# Patient Record
Sex: Female | Born: 1940 | Race: White | Hispanic: No | Smoking: Former smoker
Health system: Southern US, Community
[De-identification: ages and names within clinical notes are randomized; demographics above are authoritative.]

## PROBLEM LIST (undated history)

## (undated) DIAGNOSIS — I4510 Unspecified right bundle-branch block: Secondary | ICD-10-CM

## (undated) DIAGNOSIS — C189 Malignant neoplasm of colon, unspecified: Secondary | ICD-10-CM

## (undated) DIAGNOSIS — F0280 Dementia in other diseases classified elsewhere without behavioral disturbance: Secondary | ICD-10-CM

## (undated) DIAGNOSIS — E785 Hyperlipidemia, unspecified: Secondary | ICD-10-CM

## (undated) DIAGNOSIS — F32A Depression, unspecified: Secondary | ICD-10-CM

## (undated) DIAGNOSIS — I509 Heart failure, unspecified: Secondary | ICD-10-CM

## (undated) DIAGNOSIS — I2699 Other pulmonary embolism without acute cor pulmonale: Secondary | ICD-10-CM

## (undated) DIAGNOSIS — I5021 Acute systolic (congestive) heart failure: Secondary | ICD-10-CM

## (undated) DIAGNOSIS — J45909 Unspecified asthma, uncomplicated: Secondary | ICD-10-CM

## (undated) DIAGNOSIS — M810 Age-related osteoporosis without current pathological fracture: Secondary | ICD-10-CM

## (undated) DIAGNOSIS — G309 Alzheimer's disease, unspecified: Secondary | ICD-10-CM

## (undated) DIAGNOSIS — K219 Gastro-esophageal reflux disease without esophagitis: Secondary | ICD-10-CM

## (undated) DIAGNOSIS — J449 Chronic obstructive pulmonary disease, unspecified: Secondary | ICD-10-CM

## (undated) DIAGNOSIS — F329 Major depressive disorder, single episode, unspecified: Secondary | ICD-10-CM

## (undated) DIAGNOSIS — R1310 Dysphagia, unspecified: Secondary | ICD-10-CM

## (undated) HISTORY — DX: Chronic obstructive pulmonary disease, unspecified: J44.9

## (undated) HISTORY — DX: Malignant neoplasm of colon, unspecified: C18.9

## (undated) HISTORY — DX: Alzheimer's disease, unspecified: G30.9

## (undated) HISTORY — DX: Acute systolic (congestive) heart failure: I50.21

## (undated) HISTORY — PX: COLON SURGERY: SHX602

## (undated) HISTORY — PX: BACK SURGERY: SHX140

## (undated) HISTORY — DX: Unspecified asthma, uncomplicated: J45.909

## (undated) HISTORY — PX: TONSILLECTOMY: SUR1361

## (undated) HISTORY — DX: Unspecified right bundle-branch block: I45.10

## (undated) HISTORY — DX: Major depressive disorder, single episode, unspecified: F32.9

## (undated) HISTORY — DX: Depression, unspecified: F32.A

## (undated) HISTORY — DX: Gastro-esophageal reflux disease without esophagitis: K21.9

## (undated) HISTORY — DX: Heart failure, unspecified: I50.9

## (undated) HISTORY — DX: Other pulmonary embolism without acute cor pulmonale: I26.99

## (undated) HISTORY — DX: Dementia in other diseases classified elsewhere without behavioral disturbance: F02.80

## (undated) HISTORY — DX: Hyperlipidemia, unspecified: E78.5

## (undated) HISTORY — PX: ABDOMINAL HYSTERECTOMY: SHX81

---

## 1998-03-16 ENCOUNTER — Ambulatory Visit (HOSPITAL_COMMUNITY): Admission: RE | Admit: 1998-03-16 | Discharge: 1998-03-16 | Payer: Self-pay | Admitting: Neurosurgery

## 1998-06-14 ENCOUNTER — Inpatient Hospital Stay (HOSPITAL_COMMUNITY): Admission: RE | Admit: 1998-06-14 | Discharge: 1998-06-16 | Payer: Self-pay | Admitting: Neurosurgery

## 1998-12-27 ENCOUNTER — Encounter: Payer: Self-pay | Admitting: Neurosurgery

## 1998-12-27 ENCOUNTER — Ambulatory Visit (HOSPITAL_COMMUNITY): Admission: RE | Admit: 1998-12-27 | Discharge: 1998-12-27 | Payer: Self-pay | Admitting: Neurosurgery

## 1999-09-11 ENCOUNTER — Ambulatory Visit (HOSPITAL_COMMUNITY): Admission: RE | Admit: 1999-09-11 | Discharge: 1999-09-11 | Payer: Self-pay | Admitting: *Deleted

## 2000-03-13 ENCOUNTER — Ambulatory Visit (HOSPITAL_COMMUNITY): Admission: RE | Admit: 2000-03-13 | Discharge: 2000-03-13 | Payer: Self-pay | Admitting: Neurosurgery

## 2000-03-13 ENCOUNTER — Encounter: Payer: Self-pay | Admitting: Neurosurgery

## 2000-04-16 ENCOUNTER — Encounter: Payer: Self-pay | Admitting: Neurosurgery

## 2000-04-20 ENCOUNTER — Inpatient Hospital Stay (HOSPITAL_COMMUNITY): Admission: RE | Admit: 2000-04-20 | Discharge: 2000-04-22 | Payer: Self-pay | Admitting: Neurosurgery

## 2000-04-20 ENCOUNTER — Encounter: Payer: Self-pay | Admitting: Neurosurgery

## 2001-04-25 ENCOUNTER — Encounter: Payer: Self-pay | Admitting: Neurosurgery

## 2001-04-25 ENCOUNTER — Encounter: Admission: RE | Admit: 2001-04-25 | Discharge: 2001-04-25 | Payer: Self-pay | Admitting: Neurosurgery

## 2002-05-05 ENCOUNTER — Encounter: Payer: Self-pay | Admitting: Neurosurgery

## 2002-05-05 ENCOUNTER — Ambulatory Visit (HOSPITAL_COMMUNITY): Admission: RE | Admit: 2002-05-05 | Discharge: 2002-05-05 | Payer: Self-pay | Admitting: Neurosurgery

## 2002-06-07 ENCOUNTER — Encounter: Admission: RE | Admit: 2002-06-07 | Discharge: 2002-06-07 | Payer: Self-pay | Admitting: Neurosurgery

## 2002-06-07 ENCOUNTER — Encounter: Payer: Self-pay | Admitting: Neurosurgery

## 2002-06-22 ENCOUNTER — Encounter: Payer: Self-pay | Admitting: Neurosurgery

## 2002-06-22 ENCOUNTER — Encounter: Admission: RE | Admit: 2002-06-22 | Discharge: 2002-06-22 | Payer: Self-pay | Admitting: Neurosurgery

## 2004-09-28 ENCOUNTER — Emergency Department (HOSPITAL_COMMUNITY): Admission: EM | Admit: 2004-09-28 | Discharge: 2004-09-29 | Payer: Self-pay | Admitting: Emergency Medicine

## 2004-12-25 ENCOUNTER — Ambulatory Visit (HOSPITAL_COMMUNITY): Admission: RE | Admit: 2004-12-25 | Discharge: 2004-12-25 | Payer: Self-pay | Admitting: Neurosurgery

## 2005-07-24 ENCOUNTER — Ambulatory Visit: Payer: Self-pay | Admitting: Psychiatry

## 2006-03-16 ENCOUNTER — Ambulatory Visit (HOSPITAL_COMMUNITY): Admission: RE | Admit: 2006-03-16 | Discharge: 2006-03-16 | Payer: Self-pay | Admitting: Neurosurgery

## 2006-10-21 ENCOUNTER — Ambulatory Visit (HOSPITAL_COMMUNITY): Payer: Self-pay | Admitting: Psychology

## 2006-10-25 ENCOUNTER — Ambulatory Visit (HOSPITAL_COMMUNITY): Payer: Self-pay | Admitting: Psychology

## 2006-11-17 ENCOUNTER — Ambulatory Visit (HOSPITAL_COMMUNITY): Payer: Self-pay | Admitting: Psychology

## 2006-12-28 ENCOUNTER — Ambulatory Visit (HOSPITAL_COMMUNITY): Payer: Self-pay | Admitting: Psychology

## 2007-01-13 ENCOUNTER — Ambulatory Visit (HOSPITAL_COMMUNITY): Payer: Self-pay | Admitting: Psychology

## 2007-04-30 ENCOUNTER — Emergency Department (HOSPITAL_COMMUNITY): Admission: EM | Admit: 2007-04-30 | Discharge: 2007-04-30 | Payer: Self-pay | Admitting: Emergency Medicine

## 2007-05-12 ENCOUNTER — Ambulatory Visit (HOSPITAL_COMMUNITY): Payer: Self-pay | Admitting: Psychology

## 2007-06-10 ENCOUNTER — Ambulatory Visit (HOSPITAL_COMMUNITY): Payer: Self-pay | Admitting: Psychology

## 2007-07-01 ENCOUNTER — Ambulatory Visit (HOSPITAL_COMMUNITY): Payer: Self-pay | Admitting: Psychology

## 2007-08-02 ENCOUNTER — Ambulatory Visit (HOSPITAL_COMMUNITY): Payer: Self-pay | Admitting: Psychology

## 2007-09-01 ENCOUNTER — Ambulatory Visit (HOSPITAL_COMMUNITY): Payer: Self-pay | Admitting: Psychology

## 2008-09-16 ENCOUNTER — Emergency Department (HOSPITAL_COMMUNITY): Admission: EM | Admit: 2008-09-16 | Discharge: 2008-09-16 | Payer: Self-pay | Admitting: Emergency Medicine

## 2009-10-28 ENCOUNTER — Encounter: Admission: RE | Admit: 2009-10-28 | Discharge: 2009-10-28 | Payer: Self-pay | Admitting: Neurosurgery

## 2010-03-11 ENCOUNTER — Inpatient Hospital Stay (HOSPITAL_COMMUNITY)
Admission: RE | Admit: 2010-03-11 | Discharge: 2010-03-14 | Payer: Self-pay | Source: Home / Self Care | Admitting: Neurosurgery

## 2010-09-29 ENCOUNTER — Encounter (INDEPENDENT_AMBULATORY_CARE_PROVIDER_SITE_OTHER): Payer: Self-pay | Admitting: *Deleted

## 2010-12-10 ENCOUNTER — Ambulatory Visit (HOSPITAL_COMMUNITY): Admission: RE | Admit: 2010-12-10 | Payer: Self-pay | Source: Home / Self Care | Admitting: Internal Medicine

## 2010-12-11 ENCOUNTER — Observation Stay (HOSPITAL_COMMUNITY)
Admission: RE | Admit: 2010-12-11 | Discharge: 2010-12-12 | Payer: Self-pay | Source: Home / Self Care | Attending: Neurological Surgery | Admitting: Neurological Surgery

## 2010-12-11 ENCOUNTER — Encounter (INDEPENDENT_AMBULATORY_CARE_PROVIDER_SITE_OTHER): Payer: Self-pay | Admitting: Neurological Surgery

## 2011-01-04 ENCOUNTER — Encounter: Payer: Self-pay | Admitting: Internal Medicine

## 2011-01-04 ENCOUNTER — Encounter: Payer: Self-pay | Admitting: Neurosurgery

## 2011-01-05 ENCOUNTER — Encounter: Payer: Self-pay | Admitting: Internal Medicine

## 2011-01-13 NOTE — Letter (Signed)
Summary: Generic Letter, Intro to Referring  [facility]  [address]   [place], [place] [address]   Phone: [phone]  Fax: [phone]      September 29, 2010             RE: Paige Schwartz   [birth year]                 [address]                 [place], [place]  [address]  Dear Kemper Durie,    the above patient was a No Show for her appointment on 09/10/10 at 1:30pm with [place]            Sincerely,    Diana Eves  [facility] Ph: [phone]   Fax: [phone]

## 2011-01-21 NOTE — Letter (Signed)
Summary: [facility]  [facility]   Imported By: Sherian Rein 01/16/2011 08:52:59  _____________________________________________________________________  External Attachment:    Type:   Image     Comment:   External Document

## 2011-02-23 LAB — URINALYSIS, ROUTINE W REFLEX MICROSCOPIC
Bilirubin Urine: NEGATIVE
Glucose, UA: NEGATIVE mg/dL
Hgb urine dipstick: NEGATIVE
Ketones, ur: NEGATIVE mg/dL
Nitrite: POSITIVE — AB
Protein, ur: NEGATIVE mg/dL
Specific Gravity, Urine: 1.018 (ref 1.005–1.030)
Urobilinogen, UA: 1 mg/dL (ref 0.0–1.0)
pH: 6.5 (ref 5.0–8.0)

## 2011-02-23 LAB — SURGICAL PCR SCREEN: Staphylococcus aureus: POSITIVE — AB

## 2011-02-23 LAB — PROTIME-INR
INR: 0.96 (ref 0.00–1.49)
Prothrombin Time: 13 seconds (ref 11.6–15.2)

## 2011-02-23 LAB — BASIC METABOLIC PANEL
BUN: 9 mg/dL (ref 6–23)
CO2: 25 mEq/L (ref 19–32)
Calcium: 8.9 mg/dL (ref 8.4–10.5)
Chloride: 108 mEq/L (ref 96–112)
Creatinine, Ser: 0.69 mg/dL (ref 0.4–1.2)
GFR calc Af Amer: >60 mL/min (ref >60)
GFR calc non Af Amer: >60 mL/min (ref >60)
Glucose, Bld: 86 mg/dL (ref 70–99)
Potassium: 3.8 mEq/L (ref 3.5–5.1)
Sodium: 139 mEq/L (ref 135–145)

## 2011-02-23 LAB — URINE MICROSCOPIC-ADD ON

## 2011-02-23 LAB — CBC
HCT: 35.7 % — ABNORMAL LOW (ref 36.0–46.0)
Hemoglobin: 11.3 g/dL — ABNORMAL LOW (ref 12.0–15.0)
MCH: 27.3 pg (ref 26.0–34.0)
MCHC: 31.7 g/dL (ref 30.0–36.0)
MCV: 86.2 fL (ref 78.0–100.0)
Platelets: 249 10*3/uL (ref 150–400)
RBC: 4.14 MIL/uL (ref 3.87–5.11)
RDW: 16.4 % — ABNORMAL HIGH (ref 11.5–15.5)
WBC: 5.4 10*3/uL (ref 4.0–10.5)

## 2011-03-09 LAB — COMPREHENSIVE METABOLIC PANEL
ALT: 16 U/L (ref 0–35)
AST: 24 U/L (ref 0–37)
Albumin: 3.3 g/dL — ABNORMAL LOW (ref 3.5–5.2)
Alkaline Phosphatase: 68 U/L (ref 39–117)
BUN: 12 mg/dL (ref 6–23)
CO2: 28 mEq/L (ref 19–32)
Calcium: 9.2 mg/dL (ref 8.4–10.5)
Chloride: 109 mEq/L (ref 96–112)
Creatinine, Ser: 0.91 mg/dL (ref 0.4–1.2)
GFR calc Af Amer: >60 mL/min (ref >60)
GFR calc non Af Amer: >60 mL/min (ref >60)
Glucose, Bld: 84 mg/dL (ref 70–99)
Potassium: 4 mEq/L (ref 3.5–5.1)
Sodium: 145 mEq/L (ref 135–145)
Total Bilirubin: 0.3 mg/dL (ref 0.3–1.2)
Total Protein: 6.2 g/dL (ref 6.0–8.3)

## 2011-03-09 LAB — URINALYSIS, ROUTINE W REFLEX MICROSCOPIC
Bilirubin Urine: NEGATIVE
Hgb urine dipstick: NEGATIVE
Ketones, ur: 15 mg/dL — AB
Nitrite: POSITIVE — AB
Protein, ur: NEGATIVE mg/dL
Specific Gravity, Urine: 1.02 (ref 1.005–1.030)
Urobilinogen, UA: 0.2 mg/dL (ref 0.0–1.0)

## 2011-03-09 LAB — DIFFERENTIAL
Basophils Absolute: 0 10*3/uL (ref 0.0–0.1)
Basophils Relative: 1 % (ref 0–1)
Eosinophils Absolute: 0.1 10*3/uL (ref 0.0–0.7)
Eosinophils Relative: 2 % (ref 0–5)
Lymphocytes Relative: 14 % (ref 12–46)
Lymphs Abs: 0.9 10*3/uL (ref 0.7–4.0)
Monocytes Absolute: 0.4 10*3/uL (ref 0.1–1.0)
Monocytes Relative: 6 % (ref 3–12)
Neutro Abs: 5.3 10*3/uL (ref 1.7–7.7)
Neutrophils Relative %: 78 % — ABNORMAL HIGH (ref 43–77)

## 2011-03-09 LAB — CBC
HCT: 38.5 % (ref 36.0–46.0)
Hemoglobin: 13.1 g/dL (ref 12.0–15.0)
MCHC: 34 g/dL (ref 30.0–36.0)
MCV: 88.2 fL (ref 78.0–100.0)
Platelets: 189 10*3/uL (ref 150–400)
RBC: 4.37 MIL/uL (ref 3.87–5.11)
RDW: 16.3 % — ABNORMAL HIGH (ref 11.5–15.5)
WBC: 6.9 10*3/uL (ref 4.0–10.5)

## 2011-03-09 LAB — PROTIME-INR
INR: 0.98 (ref 0.00–1.49)
Prothrombin Time: 12.9 seconds (ref 11.6–15.2)

## 2011-03-09 LAB — SURGICAL PCR SCREEN
MRSA, PCR: NEGATIVE
Staphylococcus aureus: NEGATIVE

## 2011-03-09 LAB — APTT: aPTT: 22 seconds — ABNORMAL LOW (ref 24–37)

## 2011-05-01 NOTE — Op Note (Signed)
[place]  Patient:    NIOKA, THORINGTON                      MRN: [id number] Proc. Date: 04/20/00 Adm. Date:  [id number] Attending:  Emeterio Reeve                           Operative Report  PREOPERATIVE DIAGNOSIS:  Left L5 radiculopathy related to lateral recess stenosis and herniated disk at L5-S1 in the extraforaminal position.  POSTOPERATIVE DIAGNOSIS:  Left L5 radiculopathy related to lateral recess stenosis and herniated disk at L5-S1 in the extraforaminal position.  OPERATION PERFORMED:  Left L5-S1 extraforaminal decompression.  SURGEON:  Payton Doughty, M.D.  ANESTHESIA:  General.  PREP:  Sterile Betadine prep and scrub with alcohol wipe.  COMPLICATIONS:  None.  DESCRIPTION OF PROCEDURE:  The patient is a 70 year old lady with a left L5 radiculopathy related to a foraminal compression at L5-S1 extraforaminally. The patient was taken to the operating room, smoothly anesthetized and intubated, and placed prone on the operating table.  Following shave, prep and drape in the usual sterile fashion the skin was infiltrated with 1% lidocaine 1:400,000 epinephrine.  The skin was incised from approximately S2 up to L4. The old fusion from L4 to S1 was identified at its superior end and x-ray confirmed the correctness of level of dissection laterally to the fusion revealed the sacral ala and dissection anteriorly and medially revealed the superior edge of ala.  Intraoperative x-ray confirmed that the L5-S1 neural foramen had been identified.  Part of the sacral ala was removed with a drill and the L5 nerve root carefully identified just above the S1 pedicle. Decompression of this nerve root was undertaken and careful exploration revealed that there was a minimally bulging disk which was grasped and annular fibers removed but there was no large herniation.  The tight stenosis was relieved by undercutting the fusion to decompress the nerve  root.  The wound was irrigated and hemostasis assured.  The nerve root was covered with Depo-Medrol soaked fat.  The fascia was reapproximated with 0 Vicryl in interrupted fashion.  Subcutaneous tissues reapproximated with 0 Vicryl in interrupted fashion and the skin was closed with 3-0 nylon in a running locked fashion.  A Betadine Telfa dressing was applied and made occlusive with Op-Site.  The patient then returned to the recovery room in good condition. DD:  04/20/00 TD:  04/21/00 Job: [id number]

## 2011-05-01 NOTE — Discharge Summary (Signed)
[facility]  Patient:    Paige Schwartz, Paige Schwartz                      MRN: [id number] Adm. Date:  [id number] Disc. Date: [id number] Attending:  Emeterio Reeve                           Discharge Summary  ADMITTING DIAGNOSIS:  Spondylosis, L5-S1, with left L5 and S1 radiculopathy.  DISCHARGE DIAGNOSIS:  Spondylosis, L5-S1, With left L5 and S1 radiculopathy.  PROCEDURE:  L5-S1 decompression on the left side.  SERVICE:  Neurosurgery.  COMPLICATIONS:  None.  DISCHARGE STATUS:  Alive and well.  HISTORY OF PRESENT ILLNESS:  A 70 year old right-handed, white female who has had numerous spine operations.  She has a fusion at L4-5 and L5-S1, and has developed L5 and S1 radiculopathies after mopping.  MR showed disk and she is admitted for decompression.  PAST MEDICAL HISTORY:  Remarkable for an MI six months ago.  MEDICATIONS:  Prilosec and Paxil.  ALLERGIES:  CODEINE.  PHYSICAL EXAMINATION:  GENERAL:  Exam is unremarkable.  NEUROLOGIC:  Left L5 radiculopathy.  HOSPITAL COURSE:  She was admitted after ascertainment of normal laboratory values and underwent exploration of the left L5, S1 root on the left side.  A small disk was found.  Postoperatively, she has done extremely well.  Her strength is full.  Her incision is dry.  She had one episode of leg pain which resolved.  She is now being discharged home with full strength and well-healing incision.  Her follow-up will be in the [facility] office in about 10 days for suture removal. DD:  04/22/00 TD:  04/23/00 Job: [id number]

## 2011-05-01 NOTE — H&P (Signed)
[place]  Patient:    Paige Schwartz, Paige Schwartz                      MRN: [id number] Adm. Date:  [id number] Attending:  Emeterio Reeve                         History and Physical  ADMITTING DIAGNOSIS: Spondylosis with herniated disk at L5-S1 on the left side.  HISTORY OF PRESENT ILLNESS: This patient is a now 70 year old right-handed white female who has had an extensive spine history.  She has had decompressive laminectomies and diskectomies at L4-5 and L5-S1.  She had a fusion at L4-5 and L5-S1 and removal of hardware at L4-5 and L5-S1.  She has also had anterior cervical diskectomy and fusion.  She was mopping in January 2001 and had an increase in back pain.  She wanted to hold off on an operation and then came back in March with worsening of her back and left lower extremity pain, and positive straight leg raise, and an MRI was obtained that demonstrated foraminal disk at L5-S1 eccentric to the left side underneath her fusion, and she is now admitted for diskectomy.  PAST MEDICAL HISTORY: Remarkable for MI about six months ago, asymptomatic from that, and catheterization did not show any major blockages. MEDICATIONS:  1. Prilosec 20 mg q.d.  2. Paxil 20 mg q.d.  PAST SURGICAL HISTORY:  1. Cholecystectomy.  2. Hysterectomy.  ALLERGIES: CODEINE.  SOCIAL HISTORY: She does not smoke and does not drink.  She is disabled from [employer].  FAMILY HISTORY: Both parents died of myocardial infarction in their early 58s.  REVIEW OF SYSTEMS: Noncontributory.  PHYSICAL EXAMINATION:  HEENT: Within normal limits.  NECK: She has reasonable range of motion of her neck.  CHEST: Clear.  CARDIAC: Regular rate and rhythm.  ABDOMEN: Nontender, no hepatosplenomegaly.  EXTREMITIES: Without clubbing or cyanosis.  Peripheral pulses are good.  GU: Deferred.  NEUROLOGIC: She is awake and alert and oriented.  Cranial nerves intact. Motor  examination shows 5/5 strength throughout the upper and lower extremities.  She has a left S1 sensory deficit and absent ankle jerk on the left.  Straight leg raise is positive on the left.  CLINICAL IMPRESSION: Lumbar spondylosis with foraminal disk at L5-S1.  PLAN: The plan is for exploration of the extraforaminal space on the left side at L5-S1 for diskectomy.  The risks and benefits of this approach have been discussed with her and she wishes to proceed. DD:  04/20/00 TD:  04/20/00 Job: [id number]

## 2011-09-15 LAB — DIFFERENTIAL
Basophils Absolute: 0
Eosinophils Relative: 2
Lymphocytes Relative: 16
Lymphs Abs: 1.5
Monocytes Absolute: 0.4
Neutro Abs: 7.2

## 2011-09-15 LAB — POCT I-STAT, CHEM 8
BUN: 11
Calcium, Ion: 1.02 — ABNORMAL LOW
Chloride: 119 — ABNORMAL HIGH
Creatinine, Ser: 0.9
TCO2: 22

## 2011-09-15 LAB — CBC
HCT: 37.8
Hemoglobin: 12.2
RBC: 4.61
WBC: 9.3

## 2014-09-20 ENCOUNTER — Ambulatory Visit (INDEPENDENT_AMBULATORY_CARE_PROVIDER_SITE_OTHER)
Admission: RE | Admit: 2014-09-20 | Discharge: 2014-09-20 | Disposition: A | Discharge status: Home / Self Care | Payer: Medicare Other | Source: Ambulatory Visit | Attending: Internal Medicine | Admitting: Internal Medicine

## 2014-09-20 ENCOUNTER — Ambulatory Visit (INDEPENDENT_AMBULATORY_CARE_PROVIDER_SITE_OTHER): Payer: Medicare Other | Admitting: Internal Medicine

## 2014-09-20 ENCOUNTER — Encounter: Payer: Self-pay | Admitting: Internal Medicine

## 2014-09-20 VITALS — BP 90/60 | HR 72 | Temp 98.4°F | Ht 63.5 in | Wt 157.0 lb

## 2014-09-20 DIAGNOSIS — F17210 Nicotine dependence, cigarettes, uncomplicated: Secondary | ICD-10-CM | POA: Insufficient documentation

## 2014-09-20 DIAGNOSIS — J449 Chronic obstructive pulmonary disease, unspecified: Secondary | ICD-10-CM

## 2014-09-20 DIAGNOSIS — Z720 Tobacco use: Secondary | ICD-10-CM

## 2014-09-20 MED ORDER — PREDNISONE 10 MG PO TABS: ORAL_TABLET | ORAL | Status: DC

## 2014-09-20 NOTE — Assessment & Plan Note (Signed)
DDX of  difficult airways management all start with A and  include Adherence, Ace Inhibitors, Acid Reflux, Active Sinus Disease, Alpha 1 Antitripsin deficiency, Anxiety masquerading as Airways dz,  ABPA,  allergy(esp in young), Aspiration (esp in elderly), Adverse effects of DPI,  Active smokers, plus two Bs  = Bronchiectasis and Beta blocker use..and one C= CHF  Adherence is always the initial "prime suspect" and is a multilayered concern that requires a "trust but verify" approach in every patient - starting with knowing how to use medications, especially inhalers, correctly, keeping up with refills and understanding the fundamental difference between maintenance and prns vs those medications only taken for a very short course and then stopped and not refilled.  The proper method of use, as well as anticipated side effects, of a metered-dose inhaler are discussed and demonstrated to the patient. Improved effectiveness after extensive coaching during this visit to a level of approximately  50% > continue symbiocrt 160 2bid x 2 weeks then return and change to [facility]  Active smoking > see sep a/p  ? Acid (or non-acid) GERD > always difficult to exclude as up to 75% of pts in some series report no assoc GI/ Heartburn symptoms> rec max (24h)  acid suppression and diet restrictions/ reviewed and instructions given in writing.

## 2014-09-20 NOTE — Patient Instructions (Addendum)
Nexium Take 30-60 min before first meal of the day and add pepcid ac 20 mg at bedtime until return  Prednisone 10 mg take  4 each am x 2 days,   2 each am x 2 days,  1 each am x 2 days and stop   Plan A = automatic = symbicort 160 x 2 puffs and spiriva each am and the symbicort again about 12 hours later  Plan B = Ventolin (albuterol) Only use your albuterol as a rescue medication to be used if you can't catch your breath by resting or doing a relaxed purse lip breathing pattern.  - The less you use it, the better it will work when you need it. - Ok to use up to 2 puffs  every 4 hours if you must but call for immediate appointment if use goes up over your usual need - Don't leave home without it !!  (think of it like the spare tire for your car)   Plan C = nebulizer (albuterol) 2.5 every 4 hours if plan B doesn't work  See Lynelle Smoke NP w/in 2 weeks with all your medications, even over the counter meds, separated in two separate bags, the ones you take no matter what vs the ones you stop once you feel better and take only as needed when you feel you need them.   Tammy  will generate for you a new user friendly medication calendar that will put [place] all on the same page re: your medication use.     Without this process, it simply isn't possible to assure that we are providing  your outpatient care  with  the attention to detail we feel you deserve.   If we cannot assure that you're getting that kind of care,  then we cannot manage your problem effectively from this clinic.  Once you have seen Tammy and we are sure that we're all on the same page with your medication use she will arrange follow up with me.   Please remember to go to the x-ray department downstairs for your tests - we will call you with the results when they are available.

## 2014-09-20 NOTE — Assessment & Plan Note (Signed)

## 2014-09-20 NOTE — Progress Notes (Signed)
   Subjective:    Patient ID: Paige Schwartz, female    DOB: 1941-02-07  MRN: [id number]  HPI  1 yowf active smoker with dx of copd referred by Arsenio Katz to pulmonary clinic 09/20/2014   09/20/2014 1st [facility] office visit/ [place]   Chief Complaint  Patient presents with  . Pulmonary Consult    Referred by Dr. Arsenio Katz. Pt states that she was dxed with COPD approx 5 yrs ago.  She states that she started having SOB just since Jan 2015. She gets out of breath walking from room to room, approx 20 ft.  She also c/o cough for the past wk- prod with minimal clear sputum.   uses neb alb first thing in am then ventolin then symbicort, not using spiriva consistently Onset of worse breathing was indolent but esp since jan 2015 to point of room to room.  No obvious  day to day or daytime variabilty or assoc   cp or chest tightness, subjective wheeze overt sinus or hb symptoms. No unusual exp hx or h/o childhood pna/ asthma or knowledge of premature birth.  Sleeping ok without nocturnal  or early am exacerbation  of respiratory  c/o's or need for noct saba. Also denies any obvious fluctuation of symptoms with weather or environmental changes or other aggravating or alleviating factors except as outlined above   Current Medications, Allergies, Complete Past Medical History, Past Surgical History, Family History, and Social History were reviewed in [employer] record.           Review of Systems  Constitutional: Negative for fever, chills and unexpected weight change.  HENT: Positive for congestion. Negative for dental problem, ear pain, nosebleeds, postnasal drip, rhinorrhea, sinus pressure, sneezing, sore throat, trouble swallowing and voice change.   Eyes: Negative for visual disturbance.  Respiratory: Positive for cough and shortness of breath. Negative for choking.   Cardiovascular: Negative for chest pain and leg swelling.  Gastrointestinal: Negative for  vomiting, abdominal pain and diarrhea.  Genitourinary: Negative for difficulty urinating.       Acid Heartburn  Musculoskeletal: Negative for arthralgias.  Skin: Negative for rash.  Neurological: Positive for headaches. Negative for tremors and syncope.  Hematological: Does not bruise/bleed easily.       Objective:   Physical Exam  amb wf nad  Wt Readings from Last 3 Encounters:  09/20/14 157 lb (71.215 kg)     HEENT mild turbinate edema.  Oropharynx no thrush or excess pnd or cobblestoning.  No JVD or cervical adenopathy. Mild accessory muscle hypertrophy. Trachea midline, nl thryroid. Chest was hyperinflated by percussion with diminished breath sounds and moderate increased exp time without wheeze. Hoover sign positive at mid inspiration. Regular rate and rhythm without murmur gallop or rub or increase P2 or edema.  Abd: no hsm, nl excursion. Ext warm without cyanosis or clubbing.     CXR  09/20/2014 :   1. Cardiomegaly and pulmonary venous congestion. No pulmonary edema. 2. Chronic bronchitic changes.      Assessment & Plan:

## 2014-10-03 ENCOUNTER — Ambulatory Visit: Payer: Medicare Other | Admitting: Neurology

## 2014-10-03 ENCOUNTER — Ambulatory Visit: Payer: Self-pay | Admitting: Neurology

## 2014-10-04 ENCOUNTER — Ambulatory Visit (INDEPENDENT_AMBULATORY_CARE_PROVIDER_SITE_OTHER): Payer: Medicare Other | Admitting: Adult Health

## 2014-10-04 VITALS — BP 110/80 | HR 98 | Temp 97.0°F | Ht 63.0 in | Wt 160.0 lb

## 2014-10-04 DIAGNOSIS — J449 Chronic obstructive pulmonary disease, unspecified: Secondary | ICD-10-CM

## 2014-10-04 NOTE — Patient Instructions (Addendum)
Follow med calendar closely and bring to each visit.   Plan A = automatic = symbicort 160 x 2 puffs and spiriva each am and the symbicort again about 12 hours later  Plan B = Ventolin (albuterol) Only use your albuterol as a rescue medication to be used if you can't catch your breath by resting or doing a relaxed purse lip breathing pattern.  - The less you use it, the better it will work when you need it. - Ok to use up to 2 puffs  every 4 hours if you must but call for immediate appointment if use goes up over your usual need - Don't leave home without it !!  (think of it like the spare tire for your car)   Plan C = nebulizer (albuterol) 2.5 every 4 hours if plan B doesn't work  Follow up Dr. Melvyn Novas  In 4-6 weeks with PFT and As needed

## 2014-10-04 NOTE — Progress Notes (Signed)
   Subjective:    Patient ID: Paige Schwartz, female    DOB: [birth year]  MRN: [id number]  HPI  87 yowf active smoker with dx of copd referred by Arsenio Katz to pulmonary clinic 09/20/2014   09/20/2014 1st [facility] office visit/ [place]   Chief Complaint  Patient presents with  . Pulmonary Consult    Referred by Dr. Arsenio Katz. Pt states that she was dxed with COPD approx 5 yrs ago.  She states that she started having SOB just since Jan 2015. She gets out of breath walking from room to room, approx 20 ft.  She also c/o cough for the past wk- prod with minimal clear sputum.   uses neb alb first thing in am then ventolin then symbicort, not using spiriva consistently Onset of worse breathing was indolent but esp since jan 2015 to point of room to room.  >>Symbicort rx , pred taper    10/04/2014 Follow up and Med Review  Patient returns for a two-week followup and medication review. We reviewed all her medications and organized them into a medication calendar with patient education. Appears that she is taking her medications correctly. Patient is in an assisted living but takes her on medicines. She continues to smoke, discussed smoking cessation. Last visit. Patient was started on Symbicort, and given a prednisone taper. Patient states she is feeling much improved. She denies any hemoptysis, orthopnea, PND, or leg swelling  Current Medications, Allergies, Complete Past Medical History, Past Surgical History, Family History, and Social History were reviewed in [employer] record.           Review of Systems  Constitutional:   No  weight loss, night sweats,  Fevers, chills,  +fatigue, or  lassitude.  HEENT:   No headaches,  Difficulty swallowing,  Tooth/dental problems, or  Sore throat,                No sneezing, itching, ear ache, nasal congestion, post nasal drip,   CV:  No chest pain,  Orthopnea, PND, swelling in lower extremities, anasarca,  dizziness, palpitations, syncope.   GI  No heartburn, indigestion, abdominal pain, nausea, vomiting, diarrhea, change in bowel habits, loss of appetite, bloody stools.   Resp:   No coughing up of blood.  No change in color of mucus.  No wheezing.  No chest wall deformity  Skin: no rash or lesions.  GU: no dysuria, change in color of urine, no urgency or frequency.  No flank pain, no hematuria   MS:  No joint pain or swelling.  No decreased range of motion.  No back pain.  Psych:  No change in mood or affect. No depression or anxiety.  No memory loss.         Objective:   Physical Exam  amb wf nad    HEENT mild turbinate edema.  Oropharynx no thrush or excess pnd or cobblestoning.  No JVD or cervical adenopathy. Mild accessory muscle hypertrophy. Trachea midline, nl thryroid. Chest was hyperinflated by percussion with diminished breath sounds and moderate increased exp time without wheeze. Hoover sign positive at mid inspiration. Regular rate and rhythm without murmur gallop or rub or increase P2 or edema.  Abd: no hsm, nl excursion. Ext warm without cyanosis or clubbing.     CXR  09/20/2014 :   1. Cardiomegaly and pulmonary venous congestion. No pulmonary edema. 2. Chronic bronchitic changes.      Assessment & Plan:

## 2014-10-04 NOTE — Assessment & Plan Note (Addendum)
Recent flare now improving on treatment  Needs PFT  Patient's medications were reviewed today and patient education was given. Computerized medication calendar was adjusted/completed   Plan  Follow med calendar closely and bring to each visit.   Plan A = automatic = symbicort 160 x 2 puffs and spiriva each am and the symbicort again about 12 hours later  Plan B = Ventolin (albuterol) Only use your albuterol as a rescue medication to be used if you can't catch your breath by resting or doing a relaxed purse lip breathing pattern.  - The less you use it, the better it will work when you need it. - Ok to use up to 2 puffs  every 4 hours if you must but call for immediate appointment if use goes up over your usual need - Don't leave home without it !!  (think of it like the spare tire for your car)   Plan C = nebulizer (albuterol) 2.5 every 4 hours if plan B doesn't work  Follow up Dr. Melvyn Novas  In 4-6 weeks with PFT and As needed

## 2014-10-11 ENCOUNTER — Ambulatory Visit (INDEPENDENT_AMBULATORY_CARE_PROVIDER_SITE_OTHER): Payer: Medicare Other | Admitting: Neurology

## 2014-10-11 ENCOUNTER — Encounter: Payer: Self-pay | Admitting: Neurology

## 2014-10-11 VITALS — BP 130/60 | HR 72 | Ht 63.0 in | Wt 160.0 lb

## 2014-10-11 DIAGNOSIS — G459 Transient cerebral ischemic attack, unspecified: Secondary | ICD-10-CM

## 2014-10-11 DIAGNOSIS — F17210 Nicotine dependence, cigarettes, uncomplicated: Secondary | ICD-10-CM

## 2014-10-11 DIAGNOSIS — I679 Cerebrovascular disease, unspecified: Secondary | ICD-10-CM

## 2014-10-11 DIAGNOSIS — F0150 Vascular dementia without behavioral disturbance: Secondary | ICD-10-CM

## 2014-10-11 DIAGNOSIS — F0280 Dementia in other diseases classified elsewhere without behavioral disturbance: Secondary | ICD-10-CM

## 2014-10-11 DIAGNOSIS — G309 Alzheimer's disease, unspecified: Secondary | ICD-10-CM

## 2014-10-11 DIAGNOSIS — Z720 Tobacco use: Secondary | ICD-10-CM

## 2014-10-11 LAB — LIPID PANEL
Cholesterol: 176 mg/dL (ref 0–200)
HDL: 41 mg/dL (ref >39)
LDL Cholesterol: 104 mg/dL — ABNORMAL HIGH (ref 0–99)
Total CHOL/HDL Ratio: 4.3 Ratio
Triglycerides: 154 mg/dL — ABNORMAL HIGH (ref <150)
VLDL: 31 mg/dL (ref 0–40)

## 2014-10-11 NOTE — Progress Notes (Signed)
NEUROLOGY CONSULTATION NOTE  Paige Schwartz MRN: [id number] DOB: 1941-02-06  Referring provider: Dr. Manuella Ghazi Primary care provider: Dr. Manuella Ghazi  Reason for consult:  Dementia.  HISTORY OF PRESENT ILLNESS: Paige Schwartz is a 73 year old right-handed woman with history of COPD, asthma, CHF, CAD, esophageal reflux, cardiomegaly, lumbago, depression, Alzheimer's disease, and smoker who presents for evaluation of Alzheimer's dementia.  She is accompanied by her son.  History is limited.  She was diagnosed with Alzheimer's disease about 10 years ago.  Her son does not know what her symptoms were which led to this diagnosis.  She was started on Aricept and was taking it for about 4 years until she ran out and never had it refilled.  She lives in [place].  She does not typically like to socialize and spends her day mostly in the apartment watching TV.  She is able to bathe, dress, and feed herself.  She keeps the apartment clean.  She is able to manage and administer her own medications.  She does have some depression.  She will partake in exercise classes but not social activities.  She has short-term memory problems such as forgetting conversations and sometimes not remembering names of acquaintances.  She has no problem recognizing faces.  She denies episodes of confusion or hallucinations.  She used to drive, but was advised not to by her PCP.  Her son has not noticed any significant progression over the years.  She reports prior history of "mini-strokes."  She is not on antiplatelet therapy.  She has had some falls in the past.   She was started on Namenda XR in August and has been titrated to 28mg  daily.  She has been tolerating it.  PAST MEDICAL HISTORY: Past Medical History  Diagnosis Date  . COPD (chronic obstructive pulmonary disease)   . Colon cancer   . GERD (gastroesophageal reflux disease)   . Depression   . Alzheimer disease     PAST SURGICAL HISTORY: Past Surgical History    Procedure Laterality Date  . Colon surgery    . Back surgery      x2    MEDICATIONS: Current Outpatient Prescriptions on File Prior to Visit  Medication Sig Dispense Refill  . albuterol (PROAIR HFA) 108 (90 BASE) MCG/ACT inhaler Inhale 2 puffs into the lungs every 6 (six) hours as needed for wheezing or shortness of breath.      Marland Kitchen albuterol (PROVENTIL) (2.5 MG/3ML) 0.083% nebulizer solution Take 2.5 mg by nebulization 4 (four) times daily.      . furosemide (LASIX) 20 MG tablet Take 20 mg by mouth daily.      . Memantine HCl ER (NAMENDA XR) 28 MG CP24 Take 1 capsule by mouth daily.      . potassium chloride (K-DUR) 10 MEQ tablet Take 10 mEq by mouth daily.       No current facility-administered medications on file prior to visit.    ALLERGIES: Allergies  Allergen Reactions  . Tolmetin Anaphylaxis  . Bactrim [Sulfamethoxazole-Tmp Ds]   . Celexa [Citalopram Hydrobromide]   . Codeine Hives and Swelling  . Indocin [Indomethacin]   . Protonix [Pantoprazole Sodium]   . Sulfa Antibiotics   . Tussin [Guaifenesin]     FAMILY HISTORY: Family History  Problem Relation Age of Onset  . Colon cancer Sister   . Colon cancer Brother   . Lung cancer Brother     smoked    SOCIAL HISTORY: History   Social History  .  Marital Status: Widowed    Spouse Name: N/A    Number of Children: N/A  . Years of Education: N/A   Occupational History  . Retired    Social History Main Topics  . Smoking status: Current Every Day Smoker -- 1.00 packs/day for 40 years    Types: Cigarettes  . Smokeless tobacco: Never Used  . Alcohol Use: No  . Drug Use: No  . Sexual Activity: Not on file   Other Topics Concern  . Not on file   Social History Narrative  . No narrative on file    REVIEW OF SYSTEMS: Constitutional: No fevers, chills, or sweats, no generalized fatigue, change in appetite Eyes: No visual changes, double vision, eye pain Ear, nose and throat: No hearing loss, ear pain,  nasal congestion, sore throat Cardiovascular: No chest pain, palpitations Respiratory:  Shortness of breath GastrointestinaI: No nausea, vomiting, diarrhea, abdominal pain, fecal incontinence Genitourinary:  No dysuria, urinary retention or frequency Musculoskeletal:  No neck pain, back pain Integumentary: No rash, pruritus, skin lesions Neurological: as above Psychiatric: Depression Endocrine: No palpitations, fatigue, diaphoresis, mood swings, change in appetite, change in weight, increased thirst Hematologic/Lymphatic:  No anemia, purpura, petechiae. Allergic/Immunologic: no itchy/runny eyes, nasal congestion, recent allergic reactions, rashes  PHYSICAL EXAM: Filed Vitals:   10/11/14 1257  BP: 130/60  Pulse: 72   General: No acute distress Head:  Normocephalic/atraumatic Neck: supple, no paraspinal tenderness, full range of motion Back: No paraspinal tenderness Heart: regular rate and rhythm Lungs: Clear to auscultation bilaterally. Vascular: No carotid bruits. Neurological Exam: Mental status: alert and oriented to person, place, and time, recent memory poor and remote memory intact, fund of knowledge intact, attention and concentration intact, speech fluent and not dysarthric, language intact. Montreal Cognitive Assessment  10/11/2014 10/11/2014  Visuospatial/ Executive (0/5) 2 2  Naming (0/3) 3 3  Attention: Read list of digits (0/2) 1 1  Attention: Read list of letters (0/1) 1 1  Attention: Serial 7 subtraction starting at 100 (0/3) 0 0  Language: Repeat phrase (0/2) 1 1  Language : Fluency (0/1) 0 0  Abstraction (0/2) 0 0  Delayed Recall (0/5) 0 0  Orientation (0/6) 6 6  Total 14 14  Adjusted Score (based on education) 15 15   Cranial nerves: CN I: not tested CN II: Left surgical pupil.  Right pupil round and reactive to light, visual fields intact, fundi not visualized. CN III, IV, VI:  full range of motion, no nystagmus, no ptosis CN V: facial sensation  intact CN VII: upper and lower face symmetric CN VIII: hearing intact CN IX, X: gag intact, uvula midline CN XI: sternocleidomastoid and trapezius muscles intact CN XII: tongue midline Bulk & Tone: normal, no fasciculations. Motor: 5/5 throughout Sensation: temperature and vibration intact Deep Tendon Reflexes: 1+ throughout.  Left Babinski Finger to nose testing: no dysmetria Gait: ambulates with cane and limp.  Unsteady. Romberg positive.  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  Continue Namenda. 2.  Will get MRI of the brain to evaluate for micro-hemorrhages (cerebral amyloid angiopathy).  If there is no evidence of such, would recommend starting ASA 81mg  daily. 3.  Will check fasting lipid panel.  LDL goal should be less than 100.  If it is elevated, I will defer to PCP whether statin should be initiated.  It may not clinically change quality of life given her medical history of COPD. 4.  I would not recommend driving. 5.  Encouraged social  interactions, playing crossword and word search games. 6.  Smoking cessation 7.  Follow up in 6 months.  60 minutes spent with patient and son, over 50% spent discussing etiologies and coordinating plan.  Thank you for allowing me to take part in the care of this patient.  Metta Clines, DO  CC:  Monico Blitz, MD

## 2014-10-11 NOTE — Patient Instructions (Addendum)
1.  We will get an MRI of the brain [facility] 10/26/14 at 2:45 pm  2.  We will check fasting lipid panel 3.  I will contact you with results of MRI and let you know if you should start a baby aspirin daily 4.  Try not to stay in the apartment all day. Go out and socialize with the other residents 5.  Play crossword puzzles and word search games 6.  Stop smoking 7.  No driving 8.  Follow up in 6 months. 9.  Continue the namenda

## 2014-10-12 ENCOUNTER — Telehealth: Payer: Self-pay | Admitting: *Deleted

## 2014-10-12 NOTE — Telephone Encounter (Signed)
Message copied by Claudie Revering on Fri Oct 12, 2014  3:37 PM ------      Message from: JAFFE, ADAM R      Created: Fri Oct 12, 2014  1:58 PM       Cholesterol looks overall okay.  The LDL is 104.  Ideally, it should be less than 100, so it's borderline.  But all things considered, I don't think we absolutely need to start a cholesterol medication.      ----- Message -----         From: Lab in [facility] Interface         Sent: 10/11/2014  11:44 PM           To: Dudley Major, DO                   ------

## 2014-10-12 NOTE — Telephone Encounter (Signed)
POA is aware that labs look normal and no medication neede at this time

## 2014-10-26 ENCOUNTER — Ambulatory Visit (HOSPITAL_COMMUNITY): Admission: RE | Admit: 2014-10-26 | Payer: Medicare Other | Source: Ambulatory Visit

## 2014-11-02 ENCOUNTER — Ambulatory Visit (HOSPITAL_COMMUNITY): Admission: RE | Admit: 2014-11-02 | Payer: Medicare Other | Source: Ambulatory Visit

## 2014-11-02 NOTE — Addendum Note (Signed)
Addended by: Parke Poisson E on: 11/02/2014 09:42 AM   Modules accepted: Orders, Medications

## 2014-11-05 ENCOUNTER — Other Ambulatory Visit: Payer: Self-pay | Admitting: Internal Medicine

## 2014-11-05 DIAGNOSIS — R0600 Dyspnea, unspecified: Secondary | ICD-10-CM

## 2014-11-06 ENCOUNTER — Ambulatory Visit: Payer: Medicare Other | Admitting: Internal Medicine

## 2014-11-21 ENCOUNTER — Ambulatory Visit: Payer: Self-pay | Admitting: Neurology

## 2015-04-12 ENCOUNTER — Ambulatory Visit: Payer: Medicare Other | Admitting: Neurology

## 2015-04-12 DIAGNOSIS — Z029 Encounter for administrative examinations, unspecified: Secondary | ICD-10-CM

## 2015-04-15 ENCOUNTER — Encounter: Payer: Self-pay | Admitting: Neurology

## 2015-04-24 ENCOUNTER — Other Ambulatory Visit: Payer: Self-pay

## 2015-04-24 MED ORDER — ALPRAZOLAM 0.25 MG PO TABS: 0.2500 mg | ORAL_TABLET | Freq: Every evening | ORAL | Status: DC | PRN

## 2015-04-24 NOTE — Telephone Encounter (Signed)
Rx faxed to [facility] @ [phone], phone number [phone]  

## 2015-04-25 ENCOUNTER — Encounter: Payer: Self-pay | Admitting: Adult Health

## 2015-04-25 ENCOUNTER — Non-Acute Institutional Stay (SKILLED_NURSING_FACILITY): Payer: Medicare Other | Admitting: Adult Health

## 2015-04-25 DIAGNOSIS — R5381 Other malaise: Secondary | ICD-10-CM | POA: Diagnosis not present

## 2015-04-25 DIAGNOSIS — K5900 Constipation, unspecified: Secondary | ICD-10-CM

## 2015-04-25 DIAGNOSIS — J45909 Unspecified asthma, uncomplicated: Secondary | ICD-10-CM

## 2015-04-25 DIAGNOSIS — F419 Anxiety disorder, unspecified: Secondary | ICD-10-CM | POA: Diagnosis not present

## 2015-04-25 DIAGNOSIS — J449 Chronic obstructive pulmonary disease, unspecified: Secondary | ICD-10-CM | POA: Diagnosis not present

## 2015-04-25 DIAGNOSIS — I5021 Acute systolic (congestive) heart failure: Secondary | ICD-10-CM | POA: Diagnosis not present

## 2015-04-25 DIAGNOSIS — B372 Candidiasis of skin and nail: Secondary | ICD-10-CM

## 2015-04-25 DIAGNOSIS — D509 Iron deficiency anemia, unspecified: Secondary | ICD-10-CM | POA: Diagnosis not present

## 2015-04-25 DIAGNOSIS — F0280 Dementia in other diseases classified elsewhere without behavioral disturbance: Secondary | ICD-10-CM

## 2015-04-25 DIAGNOSIS — I2699 Other pulmonary embolism without acute cor pulmonale: Secondary | ICD-10-CM | POA: Diagnosis not present

## 2015-04-25 DIAGNOSIS — L853 Xerosis cutis: Secondary | ICD-10-CM | POA: Diagnosis not present

## 2015-04-25 DIAGNOSIS — G309 Alzheimer's disease, unspecified: Secondary | ICD-10-CM

## 2015-04-25 NOTE — Progress Notes (Signed)
Patient ID: Paige Schwartz, female   DOB: [birth year], 74 y.o.   MRN: [id number]   04/25/2015  Facility:  Nursing Home Location:  [place] Room Number: 275-1 LEVEL OF CARE:  SNF (31)   Chief Complaint  Patient presents with  . Hospitalization Follow-up    Physical deconditioning, sytolic heart failure, PE, COPD, anxiety, constipation, anemia and candida, skin   HISTORY OF PRESENT ILLNESS:  This is a 74 year old female who has been admitted to [facility] on 04/24/15 from [facility]. She has PMH of asthma, colon cancer, CHF, Alzheimer's dementia and COPD. She was recently put on oxygen at home and was experiencing worsening SOB so she went to the ED for further evaluation. Her O2 sat while on room air was 71%. ABG reveals widened A-a gradient. She was placed on BiPAP in the ED to maintain O2 saturation. CTPA revealed extensive bilateral pulmonary emboli. There was a mild bilateral upper lobe and middle lobe interstitial versus edema versus scarring. She was admitted to ICU and treated with Lovenox then transition to apixaban. She was empirically treated with Ceftriaxone. Echocardiogram revealed mild diffuse dilation of the left ventricle with EF 35-40%. Follow-up chest x-rays revealed infiltrates consistent with pulmonary edema. She was given IV Lasix with good response.  She has been admitted for a short-term rehabilitation.  PAST MEDICAL HISTORY:  Past Medical History  Diagnosis Date  . COPD (chronic obstructive pulmonary disease)   . Colon cancer   . GERD (gastroesophageal reflux disease)   . Depression   . Alzheimer disease     CURRENT MEDICATIONS: Reviewed per MAR/see medication list  Allergies  Allergen Reactions  . Tolmetin Anaphylaxis  . Bactrim [Sulfamethoxazole-Trimethoprim]   . Celexa [Citalopram Hydrobromide]   . Codeine Hives and Swelling  . Indocin [Indomethacin]   . Protonix [Pantoprazole Sodium]   . Sulfa  Antibiotics   . Tussin [Guaifenesin]      REVIEW OF SYSTEMS:  GENERAL: no change in appetite, no fatigue, no weight changes, no fever, chills or weakness RESPIRATORY: no cough, SOB, DOE, wheezing, hemoptysis CARDIAC: no chest pain, edema or palpitations GI: no abdominal pain, diarrhea, constipation, heart burn, nausea or vomiting  PHYSICAL EXAMINATION  GENERAL: no acute distress, normal body habitus SKIN:  Erythematous rashes on sacral area; dry skin on extremities EYES: conjunctivae normal, sclerae normal, normal eye lids NECK: supple, trachea midline, no neck masses, no thyroid tenderness, no thyromegaly LYMPHATICS: no LAN in the neck, no supraclavicular LAN RESPIRATORY: breathing is even & unlabored, BS CTAB CARDIAC: RRR, no murmur,no extra heart sounds, no edema GI: abdomen soft, normal BS, no masses, no tenderness, no hepatomegaly, no splenomegaly EXTREMITIES:   Able to move 4 extremities. PSYCHIATRIC: the patient is alert & oriented to person, affect & behavior appropriate  LABS/RADIOLOGY: Labs reviewed: 04/17/15  sodium 136 potassium 5.0 glucose 241 BUN 24 creatinine 1.16 calcium 9.1 alkaline phosphatase 86 total bilirubin 0.72 total protein 7.6 albumin 2.8 SGPT 44  AST 28 GFR 46 WBC 24.8 hemoglobin 12.2 hematocrit 40.3 MCV 68 CT angio pulmonary: 1. Bilateral pulmonary emboli 2: Mild interstitial infiltrate/edema versus scarring upper lobes and right middle lobe 3: Small hiatal hernia Chest x-ray shows stable chest without interval acute finding  Lipid Panel:  Recent Labs  10/11/14 1428  HDL 41    ASSESSMENT/PLAN:  Physical deconditioning - for rehabilitation Acute systolic heart failure - continue Lasix 40 mg by mouth daily and start KCL 10 meq PO daily;  BP is low, addition of ace1 is not allowed at this time; BP twice a day 1 week Pulmonary embolism - continue Eliquis 5 mg 1 tab by mouth twice a day Mixed COPD - stable; continue Symbicort 80-4 0.5 g inhale 2  puffs into lungs twice a day, Spiriva 18 g inhale 1 capsule content daily, albuterol 15 4 times a day and albuterol neb when necessary Anxiety - mood is stable; continue Xanax 0.25 mg 1 tab by mouth daily at bedtime when necessary Constipation - continue MiraLAX 17 g by mouth daily Anemia, iron deficiency - hemoglobin 10.4; continue sulfate 325 mg 1 tab by mouth daily Candida, skin - apply nystatin cream to rashes on sacral area twice a day 2 weeks Dry skin - apply Hydrophor ointment to bilateral upper extremity and bilateral lower extremity daily Alzheimer's Dementia - stable Asthma - continue Singulair 10 mg 1 by mouth daily at bedtime  Goals of care:  Short-term rehabilitation   Labs/test ordered:  CBC, BMP  Spent 50 minutes in patient care.    [facility], NP [employer] [phone]

## 2015-04-26 ENCOUNTER — Non-Acute Institutional Stay (SKILLED_NURSING_FACILITY): Payer: Medicare Other | Admitting: Internal Medicine

## 2015-04-26 DIAGNOSIS — Z86711 Personal history of pulmonary embolism: Secondary | ICD-10-CM | POA: Insufficient documentation

## 2015-04-26 DIAGNOSIS — K219 Gastro-esophageal reflux disease without esophagitis: Secondary | ICD-10-CM

## 2015-04-26 DIAGNOSIS — J449 Chronic obstructive pulmonary disease, unspecified: Secondary | ICD-10-CM | POA: Diagnosis not present

## 2015-04-26 DIAGNOSIS — K5901 Slow transit constipation: Secondary | ICD-10-CM | POA: Diagnosis not present

## 2015-04-26 DIAGNOSIS — G4700 Insomnia, unspecified: Secondary | ICD-10-CM | POA: Diagnosis not present

## 2015-04-26 DIAGNOSIS — D509 Iron deficiency anemia, unspecified: Secondary | ICD-10-CM | POA: Diagnosis not present

## 2015-04-26 DIAGNOSIS — I2699 Other pulmonary embolism without acute cor pulmonale: Secondary | ICD-10-CM | POA: Diagnosis not present

## 2015-04-26 DIAGNOSIS — B372 Candidiasis of skin and nail: Secondary | ICD-10-CM

## 2015-04-26 DIAGNOSIS — I5021 Acute systolic (congestive) heart failure: Secondary | ICD-10-CM | POA: Diagnosis not present

## 2015-04-26 DIAGNOSIS — R5381 Other malaise: Secondary | ICD-10-CM | POA: Diagnosis not present

## 2015-04-26 DIAGNOSIS — L22 Diaper dermatitis: Secondary | ICD-10-CM

## 2015-04-26 NOTE — Progress Notes (Signed)
Patient ID: Paige Schwartz, female   DOB: [birth year], 74 y.o.   MRN: [id number]     [facility]   PCP: Arsenio Katz, NP  Code Status: full code  Allergies  Allergen Reactions  . Tolmetin Anaphylaxis  . Bactrim [Sulfamethoxazole-Trimethoprim]   . Celexa [Citalopram Hydrobromide]   . Codeine Hives and Swelling  . Indocin [Indomethacin]   . Protonix [Pantoprazole Sodium]   . Sulfa Antibiotics   . Tussin [Guaifenesin]     Chief Complaint  Patient presents with  . New Admit To SNF     HPI:  74 year old patient is here for short term rehabilitation post hospital admission from 04/17/15-04/24/15 at [facility] with acute respiratory failure. She was diagnosed to have acute pulmonary embolism, chf exacerbation and possible pneumonia. She was started on lovenox and then transitioned to apixaban, responded well to diuresis and antibiotics.  She has PMH of colon cancer, CHF, Alzheimer's dementia and COPD.  She is seen in her room today. She is alert and oriented, in no distress and denies any concerns. Her breathing is stable with o2. She has been working with therapy. no new concerns from staff.   Review of Systems:  Constitutional: Negative for fever, chills, diaphoresis.  HENT: Negative for headache, congestion Eyes: Negative for eye pain, blurred vision, double vision and discharge.  Respiratory: Negative for cough, shortness of breath and wheezing.   Cardiovascular: Negative for chest pain, palpitations, leg swelling.  Gastrointestinal: Negative for heartburn, nausea, vomiting, abdominal pain. Had bowel movement this am. Appetite is fair Genitourinary: Negative for dysuria Musculoskeletal: Negative for back pain, falls Skin: Negative for itching, rash.  Neurological: Negative for dizziness, tingling, focal weakness Psychiatric/Behavioral: Negative for depression   Past Medical History  Diagnosis Date  . COPD (chronic  obstructive pulmonary disease)   . Colon cancer   . GERD (gastroesophageal reflux disease)   . Depression   . Alzheimer disease    Past Surgical History  Procedure Laterality Date  . Colon surgery    . Back surgery      x2   Social History:   reports that she has been smoking Cigarettes.  She has a 40 pack-year smoking history. She has never used smokeless tobacco. She reports that she does not drink alcohol or use illicit drugs.  Family History  Problem Relation Age of Onset  . Colon cancer Sister   . Colon cancer Brother   . Lung cancer Brother     smoked    Medications: Patient's Medications  New Prescriptions   No medications on file  Previous Medications   ALBUTEROL (PROAIR HFA) 108 (90 BASE) MCG/ACT INHALER    Inhale 2 puffs into the lungs every 4 (four) hours as needed for wheezing or shortness of breath.    ALBUTEROL (PROVENTIL) (2.5 MG/3ML) 0.083% NEBULIZER SOLUTION    Take 2.5 mg by nebulization 4 (four) times daily.   ALPRAZOLAM (XANAX) 0.25 MG TABLET    Take 1 tablet (0.25 mg total) by mouth at bedtime as needed for sleep.   APIXABAN (ELIQUIS) 5 MG TABS TABLET    Take 5 mg by mouth 2 (two) times daily.   BISACODYL (DULCOLAX) 5 MG EC TABLET    Take 5 mg by mouth daily as needed for moderate constipation.   BUDESONIDE-FORMOTEROL (SYMBICORT) 160-4.5 MCG/ACT INHALER    Inhale 2 puffs into the lungs 2 (two) times daily.   DIPHENHYDRAMINE (SOMINEX) 25 MG TABLET    Take  25 mg by mouth every 6 (six) hours as needed for sleep.   FERROUS SULFATE 325 (65 FE) MG TABLET    Take 325 mg by mouth daily.   FUROSEMIDE (LASIX) 40 MG TABLET    Take 40 mg by mouth.   PANTOPRAZOLE (PROTONIX) 40 MG TABLET    Take 40 mg by mouth daily.   POLYETHYLENE GLYCOL (MIRALAX / GLYCOLAX) PACKET    Take 17 g by mouth daily.   POTASSIUM CHLORIDE (K-DUR) 10 MEQ TABLET    Take 10 mEq by mouth daily.   SODIUM CHLORIDE (OCEAN) 0.65 % SOLN NASAL SPRAY    Place 1 spray into both nostrils as needed for  congestion.   TIOTROPIUM (SPIRIVA) 18 MCG INHALATION CAPSULE    Place 18 mcg into inhaler and inhale daily.  Modified Medications   No medications on file  Discontinued Medications   No medications on file     Physical Exam: Filed Vitals:   04/26/15 1101  BP: 120/61  Pulse: 65  Temp: 97.1 F (36.2 C)  Resp: 18  Height: 5\' 3"  (1.6 m)  Weight: 148 lb 9.6 oz (67.405 kg)  SpO2: 94%   Body mass index is 26.33 kg/(m^2).   Wt Readings from Last 3 Encounters:  04/26/15 148 lb 9.6 oz (67.405 kg)  04/25/15 145 lb 8.1 oz (66 kg)  10/11/14 160 lb (72.576 kg)    General- elderly female,  in no acute distress Head- normocephalic, atraumatic Throat- moist mucus membrane Eyes- no pallor, no icterus, no discharge, normal conjunctiva, normal sclera Neck- no cervical lymphadenopathy Cardiovascular- normal s1,s2, no murmurs, palpable dorsalis pedis, no leg edema Respiratory- bilateral diminished air entry with basilar crackles. no wheeze, no rhonchi, no use of accessory muscles, on 02 Abdomen- bowel sounds present, soft, non tender Musculoskeletal- able to move all 4 extremities, generalized weakness, uses wheelchair and walker Neurological- no focal deficit Skin- warm and dry Psychiatry- alert and oriented to person, place and time, normal mood and affect   Labs reviewed: 04/17/15  sodium 136 potassium 5.0 glucose 241 BUN 24 creatinine 1.16 calcium 9.1 alkaline phosphatase 86 total bilirubin 0.72 total protein 7.6 albumin 2.8 SGPT 44 AST 28 GFR 46 WBC 24.8 hemoglobin 12.2 hematocrit 40.3 MCV 68 CT angio pulmonary: 1. Bilateral pulmonary emboli 2: Mild interstitial infiltrate/edema versus scarring upper lobes and right middle lobe 3: Small hiatal hernia Chest x-ray shows stable chest without interval acute finding Echocardiogram revealed mild diffuse dilation of the left ventricle with EF 35-40%.    Assessment/Plan  Physical deconditioning Will have her work with physical therapy and  occupational therapy team to help with gait training and muscle strengthening exercises.fall precautions. Skin care. Encourage to be out of bed.   Acute pulmonary embolism Breathing currently stable. Continue apixaban 5 mg bid and o2  Acute CHF exacerbation Gained 3 lbs from yesterday on review. Has minimal crackles on exam. No other signs of fluid overload on exam. Currently on lasix 40 mg daily. Change this to 40 mg bid x 3 days, then 40 mg daily. Change kcl to 20 meq daily x 5 days, then 10 meq daily. Check bmp 04/29/15. Monitor daily weight.  COPD continue Symbicort and Spiriva with prn nebulizer, continue o2  Iron def anemia Continue ferrous sulfate 325 mg daily, monitor h&h  Constipation Stable, continue miralax daily and prn dulcolax  Diaper rash Continue skin care with nystatin cream  Gerd Stable, continue protonix 40 mg daily   Insomnia Stable, on xanax 0.25 mg  qhs prn  Goals of care: short term rehabilitation   Labs/tests ordered: bmp  Family/ staff Communication: reviewed care plan with patient and nursing supervisor    Blanchie Serve, MD  [facility] [phone] (Monday-Friday 8 am - 5 pm) [phone] (afterhours)

## 2015-05-10 ENCOUNTER — Encounter: Payer: Self-pay | Admitting: Adult Health

## 2015-05-10 ENCOUNTER — Non-Acute Institutional Stay (SKILLED_NURSING_FACILITY): Payer: Medicare Other | Admitting: Adult Health

## 2015-05-10 DIAGNOSIS — J449 Chronic obstructive pulmonary disease, unspecified: Secondary | ICD-10-CM | POA: Diagnosis not present

## 2015-05-10 DIAGNOSIS — F419 Anxiety disorder, unspecified: Secondary | ICD-10-CM | POA: Diagnosis not present

## 2015-05-10 DIAGNOSIS — G309 Alzheimer's disease, unspecified: Secondary | ICD-10-CM | POA: Diagnosis not present

## 2015-05-10 DIAGNOSIS — R5381 Other malaise: Secondary | ICD-10-CM

## 2015-05-10 DIAGNOSIS — I5021 Acute systolic (congestive) heart failure: Secondary | ICD-10-CM

## 2015-05-10 DIAGNOSIS — D509 Iron deficiency anemia, unspecified: Secondary | ICD-10-CM | POA: Diagnosis not present

## 2015-05-10 DIAGNOSIS — K5900 Constipation, unspecified: Secondary | ICD-10-CM

## 2015-05-10 DIAGNOSIS — I2699 Other pulmonary embolism without acute cor pulmonale: Secondary | ICD-10-CM | POA: Diagnosis not present

## 2015-05-10 DIAGNOSIS — F0280 Dementia in other diseases classified elsewhere without behavioral disturbance: Secondary | ICD-10-CM | POA: Diagnosis not present

## 2015-05-10 NOTE — Progress Notes (Signed)
Patient ID: Paige Schwartz, female   DOB: [birth year], 74 y.o.   MRN: [id number]   05/10/2015  Facility:  Nursing Home Location:  [place] Room Number: 237-6 LEVEL OF CARE:  SNF (31)   Chief Complaint  Patient presents with  . Discharge Note    Physical deconditioning, sytolic heart failure, PE, COPD, anxiety, constipation, Alheimer's dementia and anemia   HISTORY OF PRESENT ILLNESS:  This is a 74 year old female who is for discharge home with Home health PT for ambulation, transfers and endurance, OT for self-care and home management and Nursing for disease management.  She has been admitted to [facility] on 04/24/15 from [facility]. She has PMH of asthma, colon cancer, CHF, Alzheimer's dementia and COPD. She was recently put on oxygen at home and was experiencing worsening SOB so she went to the ED for further evaluation. Her O2 sat while on room air was 71%. ABG reveals widened A-a gradient. She was placed on BiPAP in the ED to maintain O2 saturation. CTPA revealed extensive bilateral pulmonary emboli. There was a mild bilateral upper lobe and middle lobe interstitial versus edema versus scarring. She was admitted to ICU and treated with Lovenox then transition to apixaban. She was empirically treated with Ceftriaxone. Echocardiogram revealed mild diffuse dilation of the left ventricle with EF 35-40%. Follow-up chest x-rays revealed infiltrates consistent with pulmonary edema. She was given IV Lasix with good response.  Patient was admitted to this facility for short-term rehabilitation after the patient's recent hospitalization.  Patient has completed SNF rehabilitation and therapy has cleared the patient for discharge.  PAST MEDICAL HISTORY:  Past Medical History  Diagnosis Date  . COPD (chronic obstructive pulmonary disease)   . Colon cancer   . GERD (gastroesophageal reflux disease)   . Depression   . Alzheimer disease   . CHF  (congestive heart failure)   . Acute systolic heart failure   . Right bundle branch block   . Acute respiratory failure with hypoxemia     CURRENT MEDICATIONS: Reviewed per MAR/see medication list  Allergies  Allergen Reactions  . Tolmetin Anaphylaxis  . Bactrim [Sulfamethoxazole-Trimethoprim]   . Celexa [Citalopram Hydrobromide]   . Codeine Hives and Swelling  . Indocin [Indomethacin]   . Protonix [Pantoprazole Sodium]   . Sulfa Antibiotics   . Tussin [Guaifenesin]      REVIEW OF SYSTEMS:  GENERAL: no change in appetite, no fatigue, no weight changes, no fever, chills or weakness RESPIRATORY: no cough, SOB, DOE, wheezing, hemoptysis CARDIAC: no chest pain, edema or palpitations GI: no abdominal pain, diarrhea, constipation, heart burn, nausea or vomiting  PHYSICAL EXAMINATION  GENERAL: no acute distress, normal body habitus EYES: conjunctivae normal, sclerae normal, normal eye lids NECK: supple, trachea midline, no neck masses, no thyroid tenderness, no thyromegaly LYMPHATICS: no LAN in the neck, no supraclavicular LAN RESPIRATORY: breathing is even & unlabored, BS CTAB CARDIAC: RRR, no murmur,no extra heart sounds, no edema GI: abdomen soft, normal BS, no masses, no tenderness, no hepatomegaly, no splenomegaly EXTREMITIES:   Able to move 4 extremities. PSYCHIATRIC: the patient is alert & oriented to person, affect & behavior appropriate  LABS/RADIOLOGY: Labs reviewed: 04/26/15  WBC 8.5 hemoglobin 9.0 hematocrit 29.6 MCV 64.9 sodium 142 potassium 4.2 glucose 83 BUN 13 creatinine 0.81 calcium 8.7 04/17/15  sodium 136 potassium 5.0 glucose 241 BUN 24 creatinine 1.16 calcium 9.1 alkaline phosphatase 86 total bilirubin 0.72 total protein 7.6 albumin 2.8 SGPT 44  AST 28 GFR 46 WBC 24.8 hemoglobin 12.2 hematocrit 40.3 MCV 68 CT angio pulmonary: 1. Bilateral pulmonary emboli 2: Mild interstitial infiltrate/edema versus scarring upper lobes and right middle lobe 3: Small hiatal  hernia Chest x-ray shows stable chest without interval acute finding  Lipid Panel:  Recent Labs  10/11/14 1428  HDL 41    ASSESSMENT/PLAN:  Physical deconditioning - for home health PT, OT and Nursing Acute systolic heart failure - continue Lasix 40 mg by mouth daily and start KCL 10 meq PO daily Pulmonary embolism - continue Eliquis 5 mg 1 tab by mouth twice a day Mixed COPD - stable; continue Symbicort 80-4 0.5 g inhale 2 puffs into lungs twice a day, Spiriva 18 g inhale 1 capsule content daily, albuterol 15 4 times a day and albuterol neb when necessary and Singulair 10 mg 1 by mouth daily at bedtime Anxiety - mood is stable; continue Xanax 0.25 mg 1 tab by mouth daily at bedtime when necessary Constipation - continue MiraLAX 17 g by mouth daily Anemia, iron deficiency - hemoglobin 9.0; continue sulfate 325 mg 1 tab by mouth daily Alzheimer's Dementia - stable    I have filled out patient's discharge paperwork and written prescriptions.  Patient will receive home health PT, OT and Nursing.  Total discharge time: Greater than 30 minutes  Discharge time involved coordination of the discharge process with social worker, nursing staff and therapy department. Medical justification for home health services verified.     [facility], NP [employer] [phone]

## 2015-06-03 ENCOUNTER — Other Ambulatory Visit: Payer: Self-pay | Admitting: Adult Health

## 2015-06-04 ENCOUNTER — Other Ambulatory Visit: Payer: Self-pay | Admitting: Adult Health

## 2015-06-05 ENCOUNTER — Other Ambulatory Visit: Payer: Self-pay | Admitting: Adult Health

## 2015-06-18 ENCOUNTER — Other Ambulatory Visit: Payer: Self-pay | Admitting: Adult Health

## 2015-08-05 ENCOUNTER — Encounter (INDEPENDENT_AMBULATORY_CARE_PROVIDER_SITE_OTHER): Payer: Medicare Other | Admitting: Ophthalmology

## 2016-01-27 NOTE — Progress Notes (Signed)
HPI: 75 yo female for evaluation of congestive heart failure. Echocardiogram August 2013 showed ejection fraction 45-50%, mild mitral regurgitation and small pericardial effusion. She has a history of bilateral pulmonary emboli 5/16. Echocardiogram 5/16 revealed mild diffuse dilation of the left ventricle with EF 35-40%. She was diuresed with improvement. Echocardiogram repeated February 2017. The ejection fraction was 50%. Mild mitral regurgitation. Mild tricuspid regurgitation. Moderate pericardial effusion. Cardiology is now asked to evaluate. 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 She has dyspnea with moderate activities. No orthopnea, PND, pedal edema, chest pain, palpitations or syncope.  Current Outpatient Prescriptions  Medication Sig Dispense Refill  . albuterol (PROAIR HFA) 108 (90 BASE) MCG/ACT inhaler Inhale 2 puffs into the lungs every 4 (four) hours as needed for wheezing or shortness of breath.     Marland Kitchen albuterol (PROVENTIL) (2.5 MG/3ML) 0.083% nebulizer solution Take 2.5 mg by nebulization 4 (four) times daily.    Marland Kitchen alendronate (FOSAMAX) 70 MG tablet Take 70 mg by mouth once a week.     Marland Kitchen apixaban (ELIQUIS) 5 MG TABS tablet Take 5 mg by mouth 2 (two) times daily.    . bisacodyl (DULCOLAX) 5 MG EC tablet Take 5 mg by mouth daily as needed for moderate constipation.    . budesonide-formoterol (SYMBICORT) 160-4.5 MCG/ACT inhaler Inhale 2 puffs into the lungs 2 (two) times daily.    . diphenhydrAMINE (SOMINEX) 25 MG tablet Take 25 mg by mouth every 6 (six) hours as needed for sleep.    Marland Kitchen escitalopram (LEXAPRO) 10 MG tablet Take 10 mg by mouth daily.     Marland Kitchen esomeprazole (NEXIUM) 40 MG capsule Take 40 mg by mouth daily at 12 noon.    . ferrous sulfate 325 (65 FE) MG tablet Take 325 mg by mouth daily.    . furosemide (LASIX) 40 MG tablet Take 40 mg by mouth.    . memantine (NAMENDA) 10 MG tablet Take 10 mg by mouth as directed.    . pantoprazole (PROTONIX) 40 MG tablet Take 40 mg by  mouth daily.    . polyethylene glycol (MIRALAX / GLYCOLAX) packet Take 17 g by mouth daily.    . potassium chloride (K-DUR) 10 MEQ tablet Take 10 mEq by mouth daily.    . sodium chloride (OCEAN) 0.65 % SOLN nasal spray Place 1 spray into both nostrils as needed for congestion.    Marland Kitchen tiotropium (SPIRIVA) 18 MCG inhalation capsule Place 18 mcg into inhaler and inhale daily.     No current facility-administered medications for this visit.    Allergies  Allergen Reactions  . Sulfa Antibiotics Other (See Comments)    Headache  . Tolmetin Anaphylaxis  . Bactrim [Sulfamethoxazole-Trimethoprim]   . Celexa [Citalopram Hydrobromide]   . Codeine Hives and Swelling  . Indocin [Indomethacin]   . Protonix [Pantoprazole Sodium]   . Tussin [Guaifenesin]      Past Medical History  Diagnosis Date  . COPD (chronic obstructive pulmonary disease) ([place])   . Colon cancer ([place])   . GERD (gastroesophageal reflux disease)   . Depression   . Alzheimer disease   . CHF (congestive heart failure) ([place])   . Acute systolic heart failure ([place])   . Right bundle branch block   . Hyperlipidemia   . Asthma   . Pulmonary embolus [facility])     Past Surgical History  Procedure Laterality Date  . Colon surgery    . Back surgery      x2  . Abdominal hysterectomy    .  Tonsillectomy      Social History   Social History  . Marital Status: Widowed    Spouse Name: N/A  . Number of Children: 3  . Years of Education: N/A   Occupational History  . Retired    Social History Main Topics  . Smoking status: Former Smoker -- 1.00 packs/day for 40 years    Types: Cigarettes  . Smokeless tobacco: Never Used  . Alcohol Use: No  . Drug Use: No  . Sexual Activity: Not on file   Other Topics Concern  . Not on file   Social History Narrative    Family History  Problem Relation Age of Onset  . Colon cancer Sister   . Colon cancer Brother   . Lung cancer Brother     smoked  . Heart disease Sister      Pacemaker    ROS: no fevers or chills, productive cough, hemoptysis, dysphasia, odynophagia, melena, hematochezia, dysuria, hematuria, rash, seizure activity, orthopnea, PND, pedal edema, claudication. Remaining systems are negative.  Physical Exam:   Blood pressure 120/78, pulse 84, height 5\' 4"  (1.626 m), weight 144 lb (65.318 kg).  General:  Well developed/chronically ill appearing in NAD Skin warm/dry Patient not depressed No peripheral clubbing Back-normal HEENT-normal/normal eyelids Neck supple/normal carotid upstroke bilaterally; no bruits; no JVD; no thyromegaly chest - diminished BS throughout CV - RRR/normal S1 and S2; no murmurs, rubs or gallops;  PMI nondisplaced Abdomen -NT/ND, no HSM, no mass, + bowel sounds, no bruit 2+ femoral pulses, no bruits Ext-no edema, chords, 2+ DP Neuro-grossly nonfocal  ECG Sinus rhythm at a rate of 83. Occasional PVC. Left axis deviation. IVCD. Nonspecific ST changes. Cannot rule out prior septal infarct.

## 2016-02-03 ENCOUNTER — Telehealth: Payer: Self-pay | Admitting: Cardiology

## 2016-02-03 NOTE — Telephone Encounter (Signed)
Received records from [facility] for appointment on 02/05/16 with Dr Stanford Breed Jule Ser)  Records given to [facility] (medical records) for Dr Jacalyn Lefevre schedule on 02/05/16. lp

## 2016-02-05 ENCOUNTER — Encounter: Payer: Self-pay | Admitting: Cardiology

## 2016-02-05 ENCOUNTER — Ambulatory Visit (INDEPENDENT_AMBULATORY_CARE_PROVIDER_SITE_OTHER): Payer: Medicare Other | Admitting: Cardiology

## 2016-02-05 VITALS — BP 120/78 | HR 84 | Ht 64.0 in | Wt 144.0 lb

## 2016-02-05 DIAGNOSIS — I319 Disease of pericardium, unspecified: Secondary | ICD-10-CM | POA: Diagnosis not present

## 2016-02-05 DIAGNOSIS — I3139 Other pericardial effusion (noninflammatory): Secondary | ICD-10-CM | POA: Insufficient documentation

## 2016-02-05 DIAGNOSIS — I5042 Chronic combined systolic (congestive) and diastolic (congestive) heart failure: Secondary | ICD-10-CM

## 2016-02-05 DIAGNOSIS — I313 Pericardial effusion (noninflammatory): Secondary | ICD-10-CM

## 2016-02-05 DIAGNOSIS — I509 Heart failure, unspecified: Secondary | ICD-10-CM | POA: Diagnosis not present

## 2016-02-05 NOTE — Assessment & Plan Note (Signed)
Patient with moderate pericardial effusion on recent echo. In reviewing records she had a small effusion in 2013. She does not have evidence of tamponade physiology on physical examination. We will plan to repeat her echocardiogram in 3 months to make sure that the fusion is stable.

## 2016-02-05 NOTE — Assessment & Plan Note (Signed)
Patient had a pulmonary embolus in May 2016. I do not have details of that encounter. I do not know of any precipitating risk factors such as travel or preceding surgery. I also do not know what the plan for duration of anticoagulation was. I will leave this to primary care.

## 2016-02-05 NOTE — Assessment & Plan Note (Signed)
Patient had a recent repeat echocardiogram and ejection fraction is low-normal to mildly reduced. She is euvolemic on examination. Continue present dose of Lasix.

## 2016-02-05 NOTE — Patient Instructions (Addendum)
Medication Instructions:   NO CHANGE  Testing/Procedures:  Your physician has requested that you have an echocardiogram. Echocardiography is a painless test that uses sound waves to create images of your heart. It provides your doctor with information about the size and shape of your heart and how well your heart's chambers and valves are working. This procedure takes approximately one hour. There are no restrictions for this procedure.IN 3 MONTHS    Follow-Up:  Your physician wants you to follow-up in: Lena will receive a reminder letter in the mail two months in advance. If you don't receive a letter, please call our office to schedule the follow-up appointment.

## 2016-04-29 ENCOUNTER — Ambulatory Visit (HOSPITAL_BASED_OUTPATIENT_CLINIC_OR_DEPARTMENT_OTHER): Admission: RE | Admit: 2016-04-29 | Payer: Medicare Other | Source: Ambulatory Visit

## 2016-04-30 ENCOUNTER — Ambulatory Visit (HOSPITAL_BASED_OUTPATIENT_CLINIC_OR_DEPARTMENT_OTHER)
Admission: RE | Admit: 2016-04-30 | Discharge: 2016-04-30 | Disposition: A | Discharge status: Home / Self Care | Payer: Medicare Other | Source: Ambulatory Visit | Attending: Cardiology | Admitting: Cardiology

## 2016-04-30 DIAGNOSIS — I509 Heart failure, unspecified: Secondary | ICD-10-CM | POA: Insufficient documentation

## 2016-04-30 DIAGNOSIS — I313 Pericardial effusion (noninflammatory): Secondary | ICD-10-CM | POA: Diagnosis not present

## 2016-04-30 NOTE — Progress Notes (Signed)
  Echocardiogram 2D Echocardiogram has been performed.  Jennette Dubin 04/30/2016, 1:03 PM

## 2016-05-06 ENCOUNTER — Telehealth: Payer: Self-pay | Admitting: Cardiology

## 2016-05-06 NOTE — Telephone Encounter (Signed)
New message ° ° ° ° ° °Calling to get echo results °

## 2016-05-06 NOTE — Telephone Encounter (Signed)
Returned call to Mr. Paige Schwartz, [place] for patient. Results and physician recommendations given, and visit arranged for PAOV w Luke on 05/19/16 at 3pm. Directions & address to [facility] office given. Understanding verbalized, caller voiced thanks for help and stated no further concerns/questions at this time.

## 2016-05-19 ENCOUNTER — Ambulatory Visit (INDEPENDENT_AMBULATORY_CARE_PROVIDER_SITE_OTHER): Payer: Medicare Other | Admitting: Cardiology

## 2016-05-19 ENCOUNTER — Encounter: Payer: Self-pay | Admitting: Cardiology

## 2016-05-19 VITALS — BP 99/61 | HR 78 | Ht 64.0 in | Wt 139.0 lb

## 2016-05-19 DIAGNOSIS — J449 Chronic obstructive pulmonary disease, unspecified: Secondary | ICD-10-CM | POA: Diagnosis not present

## 2016-05-19 DIAGNOSIS — Z86711 Personal history of pulmonary embolism: Secondary | ICD-10-CM

## 2016-05-19 DIAGNOSIS — I319 Disease of pericardium, unspecified: Secondary | ICD-10-CM

## 2016-05-19 DIAGNOSIS — I509 Heart failure, unspecified: Secondary | ICD-10-CM

## 2016-05-19 DIAGNOSIS — I429 Cardiomyopathy, unspecified: Secondary | ICD-10-CM

## 2016-05-19 DIAGNOSIS — I313 Pericardial effusion (noninflammatory): Secondary | ICD-10-CM

## 2016-05-19 DIAGNOSIS — I5022 Chronic systolic (congestive) heart failure: Secondary | ICD-10-CM

## 2016-05-19 DIAGNOSIS — I3139 Other pericardial effusion (noninflammatory): Secondary | ICD-10-CM

## 2016-05-19 NOTE — Assessment & Plan Note (Signed)
Repeat echo- small to moderate

## 2016-05-19 NOTE — Assessment & Plan Note (Signed)
Currently compensated 

## 2016-05-19 NOTE — Progress Notes (Signed)
05/19/2016 Paige Schwartz   [birth year]  GR:[id number]  Primary Physician Larene Beach, MD Primary Cardiologist: Dr Stanford Breed  HPI:  75 y/o female with a history of COPD and deconditioning. She uses a rolling walker and is on chronic O2. She has had variable LVF on past echo's. The pt was evaluated in Aug 2013 with an echo that showed her EF to 45-50%. In May 2016 she had a PE and echo then showed her EF to 35-40%. Echo done Feb 2017 in [place] showed her EF to 50% with a moderate pericardial effusion. She saw Dr Stanford Breed 01/27/16 for further evaluation. The pt has no history of chest pain and was stable when he saw her. A repeat echo was doe to f/u the pericardial effusion on 04/30/16. This revealed an EF of 30-35%, the effusion was now small to moderate. She is in the office now to discuss further evaluation and treatment. The pt denies any angina. She is doing "exercises" without problems.    Current Outpatient Prescriptions  Medication Sig Dispense Refill  . albuterol (PROAIR HFA) 108 (90 BASE) MCG/ACT inhaler Inhale 2 puffs into the lungs every 4 (four) hours as needed for wheezing or shortness of breath.     Marland Kitchen alendronate (FOSAMAX) 70 MG tablet Take 70 mg by mouth once a week.     . bisacodyl (DULCOLAX) 5 MG EC tablet Take 5 mg by mouth daily as needed for moderate constipation.    . budesonide-formoterol (SYMBICORT) 160-4.5 MCG/ACT inhaler Inhale 2 puffs into the lungs 2 (two) times daily.    . diphenhydrAMINE (SOMINEX) 25 MG tablet Take 25 mg by mouth every 6 (six) hours as needed for sleep.    Marland Kitchen escitalopram (LEXAPRO) 10 MG tablet Take 10 mg by mouth daily.     Marland Kitchen esomeprazole (NEXIUM) 40 MG capsule Take 40 mg by mouth daily at 12 noon.    . ferrous sulfate 325 (65 FE) MG tablet Take 325 mg by mouth daily.    . furosemide (LASIX) 40 MG tablet Take 40 mg by mouth.    . memantine (NAMENDA) 10 MG tablet Take 10 mg by mouth as directed.    . potassium chloride (K-DUR) 10 MEQ tablet Take 10 mEq  by mouth daily.    Marland Kitchen tiotropium (SPIRIVA) 18 MCG inhalation capsule Place 18 mcg into inhaler and inhale daily.     No current facility-administered medications for this visit.    Allergies  Allergen Reactions  . Sulfa Antibiotics Other (See Comments)    Headache  . Tolmetin Anaphylaxis  . Bactrim [Sulfamethoxazole-Trimethoprim]   . Celexa [Citalopram Hydrobromide]   . Codeine Hives and Swelling  . Indocin [Indomethacin]   . Protonix [Pantoprazole Sodium]   . Tussin [Guaifenesin]     Social History   Social History  . Marital Status: Widowed    Spouse Name: N/A  . Number of Children: 3  . Years of Education: N/A   Occupational History  . Retired    Social History Main Topics  . Smoking status: Former Smoker -- 1.00 packs/day for 40 years    Types: Cigarettes  . Smokeless tobacco: Never Used  . Alcohol Use: No  . Drug Use: No  . Sexual Activity: Not on file   Other Topics Concern  . Not on file   Social History Narrative     Review of Systems: General: negative for chills, fever, night sweats or weight changes.  Cardiovascular: negative for chest pain, edema, orthopnea, palpitations, paroxysmal nocturnal  dyspnea  Dermatological: negative for rash Respiratory: negative for cough or wheezing Urologic: negative for hematuria Abdominal: negative for nausea, vomiting, diarrhea, bright red blood per rectum, melena, or hematemesis Neurologic: negative for visual changes, syncope, or dizziness All other systems reviewed and are otherwise negative except as noted above.    Blood pressure 99/61, pulse 78, height 5\' 4"  (1.626 m), weight 139 lb (63.05 kg), SpO2 94 %.  General appearance: alert, cooperative, appears older than stated age, no distress and frail, on O2 Lungs: significant kyphosis Heart: regular rate and rhythm Extremities: no edema Neurologic: Grossly normal   ASSESSMENT AND PLAN:   History of pulmonary embolism May 2016- on Eliquis  Mixed type  COPD (chronic obstructive pulmonary disease) Chronic O2- Dr Melvyn Novas follows  CHF (congestive heart failure) ([place]) Currently compensated  Pericardial effusion Repeat echo- small to moderate   PLAN  I had a long discussion with the pt and her son. I explained the two main issues now are wether we should proceed with an ischemic evaluation to further evaluate her LVF and what medical Rx can we adjust for her LVD.   I don't feel there is room to add a beta blocker (HR was 60 for me) or an ARB (sytolic B/P 98 by me). She is currently asymptomatic.   After much discussion we decided to go ahead with a Lexiscan Myoview. She thinks she will be able to lay flat (severe kyphosis) and can raise her arms over her head. She may or may not agree to a cath but having the information would be worth proceeding.   Kerin Ransom PA-C 05/19/2016 4:26 PM

## 2016-05-19 NOTE — Patient Instructions (Signed)
Medication Instructions:  Your physician recommends that you continue on your current medications as directed. Please refer to the Current Medication list given to you today.   Labwork: None ordered  Testing/Procedures: Your physician has requested that you have a lexiscan myoview. For further information please visit [email or web address]. Please follow instruction sheet, as given.   Follow-Up: Your physician recommends that you schedule a follow-up appointment in: 4 weeks with Dr.Crenshaw   Any Other Special Instructions Will Be Listed Below (If Applicable).     If you need a refill on your cardiac medications before your next appointment, please call your pharmacy.

## 2016-05-19 NOTE — Assessment & Plan Note (Signed)
Chronic O2- Dr Melvyn Novas follows

## 2016-05-19 NOTE — Assessment & Plan Note (Signed)
May 2016- on Eliquis

## 2016-05-22 ENCOUNTER — Telehealth (HOSPITAL_COMMUNITY): Payer: Self-pay

## 2016-05-22 NOTE — Telephone Encounter (Signed)
Encounter complete. 

## 2016-05-27 ENCOUNTER — Ambulatory Visit (HOSPITAL_COMMUNITY)
Admission: RE | Admit: 2016-05-27 | Discharge: 2016-05-27 | Disposition: A | Discharge status: Home / Self Care | Payer: Medicare Other | Source: Ambulatory Visit | Attending: Cardiology | Admitting: Cardiology

## 2016-05-27 DIAGNOSIS — I509 Heart failure, unspecified: Secondary | ICD-10-CM | POA: Insufficient documentation

## 2016-05-27 DIAGNOSIS — Z87891 Personal history of nicotine dependence: Secondary | ICD-10-CM | POA: Insufficient documentation

## 2016-05-27 DIAGNOSIS — R42 Dizziness and giddiness: Secondary | ICD-10-CM | POA: Insufficient documentation

## 2016-05-27 DIAGNOSIS — R0609 Other forms of dyspnea: Secondary | ICD-10-CM | POA: Diagnosis not present

## 2016-05-27 DIAGNOSIS — I429 Cardiomyopathy, unspecified: Secondary | ICD-10-CM | POA: Insufficient documentation

## 2016-05-27 DIAGNOSIS — F329 Major depressive disorder, single episode, unspecified: Secondary | ICD-10-CM | POA: Insufficient documentation

## 2016-05-27 DIAGNOSIS — R9439 Abnormal result of other cardiovascular function study: Secondary | ICD-10-CM | POA: Insufficient documentation

## 2016-05-27 LAB — MYOCARDIAL PERFUSION IMAGING
LV dias vol: 119 mL (ref 46–106)
LV sys vol: 86 mL
Peak HR: 100 {beats}/min
Rest HR: 63 {beats}/min
SDS: 1
SRS: 3
SSS: 4
TID: 0.76

## 2016-05-27 MED ORDER — REGADENOSON 0.4 MG/5ML IV SOLN
0.4000 mg | Freq: Once | INTRAVENOUS | Status: AC
  Administered 2016-05-27: 0.4 mg via INTRAVENOUS

## 2016-05-27 MED ORDER — TECHNETIUM TC 99M TETROFOSMIN IV KIT
30.1000 | PACK | Freq: Once | INTRAVENOUS | Status: AC | PRN
  Administered 2016-05-27: 30.1 via INTRAVENOUS
  Filled 2016-05-27: qty 30

## 2016-05-27 MED ORDER — AMINOPHYLLINE 25 MG/ML IV SOLN
75.0000 mg | Freq: Once | INTRAVENOUS | Status: AC
  Administered 2016-05-27: 75 mg via INTRAVENOUS

## 2016-05-27 MED ORDER — TECHNETIUM TC 99M TETROFOSMIN IV KIT
10.1000 | PACK | Freq: Once | INTRAVENOUS | Status: AC | PRN
  Administered 2016-05-27: 10.1 via INTRAVENOUS
  Filled 2016-05-27: qty 10

## 2016-06-25 NOTE — Progress Notes (Signed)
HPI: FU congestive heart failure. Echocardiogram August 2013 showed ejection fraction 45-50%, mild mitral regurgitation and small pericardial effusion. She has a history of bilateral pulmonary emboli 5/16. 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 Echocardiogram May 2017 showed ejection fraction [id number], grade 1 diastolic dysfunction, mild mitral regurgitation and small to moderate pericardial effusion. Nuclear study June 2017 showed ejection fraction 27%. There was prior inferior infarct versus attenuation but no ischemia. Since she was last seen She has dyspnea on exertion but no orthopnea, PND, pedal edema, chest pain or syncope. She has fallen recently.  Current Outpatient Prescriptions  Medication Sig Dispense Refill  . albuterol (PROAIR HFA) 108 (90 BASE) MCG/ACT inhaler Inhale 2 puffs into the lungs every 4 (four) hours as needed for wheezing or shortness of breath.     Marland Kitchen alendronate (FOSAMAX) 70 MG tablet Take 70 mg by mouth once a week.     . bisacodyl (DULCOLAX) 5 MG EC tablet Take 5 mg by mouth daily as needed for moderate constipation.    . budesonide-formoterol (SYMBICORT) 160-4.5 MCG/ACT inhaler Inhale 2 puffs into the lungs 2 (two) times daily.    . diphenhydrAMINE (SOMINEX) 25 MG tablet Take 25 mg by mouth every 6 (six) hours as needed for sleep.    Marland Kitchen escitalopram (LEXAPRO) 10 MG tablet Take 10 mg by mouth daily.     Marland Kitchen esomeprazole (NEXIUM) 40 MG capsule Take 40 mg by mouth daily at 12 noon.    . ferrous sulfate 325 (65 FE) MG tablet Take 325 mg by mouth daily.    . furosemide (LASIX) 40 MG tablet Take 40 mg by mouth.    . memantine (NAMENDA) 10 MG tablet Take 10 mg by mouth as directed.    . potassium chloride (K-DUR) 10 MEQ tablet Take 10 mEq by mouth daily.    Marland Kitchen tiotropium (SPIRIVA) 18 MCG inhalation capsule Place 18 mcg into inhaler and inhale daily.     No current facility-administered medications for this visit.     Past Medical History  Diagnosis Date  . COPD  (chronic obstructive pulmonary disease) ([place])   . Colon cancer ([place])   . GERD (gastroesophageal reflux disease)   . Depression   . Alzheimer disease   . CHF (congestive heart failure) ([place])   . Acute systolic heart failure ([place])   . Right bundle branch block   . Hyperlipidemia   . Asthma   . Pulmonary embolus [facility])     Past Surgical History  Procedure Laterality Date  . Colon surgery    . Back surgery      x2  . Abdominal hysterectomy    . Tonsillectomy      Social History   Social History  . Marital Status: Widowed    Spouse Name: N/A  . Number of Children: 3  . Years of Education: N/A   Occupational History  . Retired    Social History Main Topics  . Smoking status: Former Smoker -- 1.00 packs/day for 40 years    Types: Cigarettes  . Smokeless tobacco: Never Used  . Alcohol Use: No  . Drug Use: No  . Sexual Activity: Not on file   Other Topics Concern  . Not on file   Social History Narrative    Family History  Problem Relation Age of Onset  . Colon cancer Sister   . Colon cancer Brother   . Lung cancer Brother     smoked  . Heart disease Sister  Pacemaker    ROS: no fevers or chills, productive cough, hemoptysis, dysphasia, odynophagia, melena, hematochezia, dysuria, hematuria, rash, seizure activity, orthopnea, PND, pedal edema, claudication. Remaining systems are negative.  Physical Exam: Well-developed frail in no acute distress.  Skin is warm and dry.  HEENT is normal.  Neck is supple.  Chest with diminished BS throughout Cardiovascular exam is regular rate and rhythm.  Abdominal exam nontender or distended. No masses palpated. Extremities show no edema. neuro grossly intact   A/P  1 Cardiomyopathy-etiology unclear. Nuclear study did not show ischemia. I do not think she is a good candidate for aggressive cardiac evaluation given overall frail body habitus and severity of COPD. I also do not think she would be a good candidate for  ICD. We will try and treat medically. Her blood pressure has been somewhat diminished previously. We will try low-dose Toprol at night at 12.5 mg daily. Add Cozaar 25 mg daily every morning. Check potassium and renal function in 1 week.  2 home O2 dependent COPD-continue oxygen. Management per primary care.  3 Combined chronic systolic and diastolic congestive heart failure-continue present dose of Lasix. Check potassium and renal function.  4 Pericardial effusion-small to moderate on most recent echo.  Kirk Ruths, MD

## 2016-07-01 ENCOUNTER — Encounter: Payer: Self-pay | Admitting: Cardiology

## 2016-07-01 ENCOUNTER — Ambulatory Visit (INDEPENDENT_AMBULATORY_CARE_PROVIDER_SITE_OTHER): Payer: Medicare Other | Admitting: Cardiology

## 2016-07-01 VITALS — BP 122/67 | HR 82 | Ht 64.0 in | Wt 142.0 lb

## 2016-07-01 DIAGNOSIS — I319 Disease of pericardium, unspecified: Secondary | ICD-10-CM

## 2016-07-01 DIAGNOSIS — I429 Cardiomyopathy, unspecified: Secondary | ICD-10-CM | POA: Diagnosis not present

## 2016-07-01 DIAGNOSIS — I3139 Other pericardial effusion (noninflammatory): Secondary | ICD-10-CM

## 2016-07-01 DIAGNOSIS — I5042 Chronic combined systolic (congestive) and diastolic (congestive) heart failure: Secondary | ICD-10-CM

## 2016-07-01 DIAGNOSIS — I313 Pericardial effusion (noninflammatory): Secondary | ICD-10-CM

## 2016-07-01 MED ORDER — METOPROLOL SUCCINATE ER 25 MG PO TB24: 25.0000 mg | ORAL_TABLET | Freq: Every day | ORAL | Status: DC

## 2016-07-01 MED ORDER — LOSARTAN POTASSIUM 25 MG PO TABS: 25.0000 mg | ORAL_TABLET | Freq: Every day | ORAL | Status: DC

## 2016-07-01 NOTE — Patient Instructions (Signed)
Medication Instructions:   START LOSARTAN 25 MG ONCE DAILY  START METOPROLOL SUCC 25 MG ONCE DAILY AT BEDTIME  Follow-Up:  Your physician recommends that you schedule a follow-up appointment in: [place]

## 2016-07-24 ENCOUNTER — Ambulatory Visit: Payer: Medicare Other | Admitting: Cardiology

## 2016-09-22 NOTE — Progress Notes (Signed)
HPI: FU cardiomyopathy and CHF. Echocardiogram August 2013 showed ejection fraction 45-50%, mild mitral regurgitation and small pericardial effusion. She has a history of bilateral pulmonary emboli 5/16. Echocardiogram repeated February 2017. The ejection fraction was 50%. Mild mitral regurgitation. Mild tricuspid regurgitation. Moderate pericardial effusion. 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 Echo repeated 5/17 and showed EF 30-35, grade 1 DD; small to moderate pericardial effusion. Nuclear study 6/17 showed EF 27; inferoseptal infarct vs artifact; no ischemia. She is treated medically and not felt to be a good candidate for ICD given severity of COPD. Since last seen, she does have dyspnea on exertion but no orthopnea, PND, pedal edema, chest pain. Her Cozaar was discontinued by her primary care physician as it was making her dizzy by report. She has had multiple falls.  Current Outpatient Prescriptions  Medication Sig Dispense Refill  . albuterol (PROAIR HFA) 108 (90 BASE) MCG/ACT inhaler Inhale 2 puffs into the lungs every 4 (four) hours as needed for wheezing or shortness of breath.     . budesonide-formoterol (SYMBICORT) 160-4.5 MCG/ACT inhaler Inhale 2 puffs into the lungs 2 (two) times daily.    Marland Kitchen escitalopram (LEXAPRO) 10 MG tablet Take 10 mg by mouth daily.     . furosemide (LASIX) 40 MG tablet Take 40 mg by mouth.    . memantine (NAMENDA) 10 MG tablet Take 10 mg by mouth as directed.    . potassium chloride (K-DUR) 10 MEQ tablet Take 10 mEq by mouth daily.    Marland Kitchen tiotropium (SPIRIVA) 18 MCG inhalation capsule Place 18 mcg into inhaler and inhale daily.     No current facility-administered medications for this visit.      Past Medical History:  Diagnosis Date  . Acute systolic heart failure ([place])   . Alzheimer disease   . Asthma   . CHF (congestive heart failure) ([place])   . Colon cancer ([place])   . COPD (chronic obstructive pulmonary disease) ([place])   . Depression   . GERD  (gastroesophageal reflux disease)   . Hyperlipidemia   . Pulmonary embolus ([place])   . Right bundle branch block     Past Surgical History:  Procedure Laterality Date  . ABDOMINAL HYSTERECTOMY    . BACK SURGERY     x2  . COLON SURGERY    . TONSILLECTOMY      Social History   Social History  . Marital status: Widowed    Spouse name: N/A  . Number of children: 3  . Years of education: N/A   Occupational History  . Retired Retired   Social History Main Topics  . Smoking status: Former Smoker    Packs/day: 1.00    Years: 40.00    Types: Cigarettes  . Smokeless tobacco: Never Used  . Alcohol use No  . Drug use: No  . Sexual activity: Not on file   Other Topics Concern  . Not on file   Social History Narrative  . No narrative on file    Family History  Problem Relation Age of Onset  . Colon cancer Sister   . Colon cancer Brother   . Lung cancer Brother     smoked  . Heart disease Sister     Pacemaker    ROS: no fevers or chills, productive cough, hemoptysis, dysphasia, odynophagia, melena, hematochezia, dysuria, hematuria, rash, seizure activity, orthopnea, PND, pedal edema, claudication. Remaining systems are negative.  Physical Exam: Well-developed chronically ill appearing in no acute distress.  Skin  is warm and dry.  HEENT is normal.  Neck is supple.  Chest with diminished BS throughout Cardiovascular exam is regular rate and rhythm.  Abdominal exam nontender or distended. No masses palpated. Extremities show no edema. neuro grossly intact  ECG-Sinus rhythm at a rate of 77. Left anterior fascicular block, IVCD, PVC, nonspecific ST changes.  A/P  1 Chronic combined systolic and diastolic congestive heart failure-patient appears to be euvolemic on examination. Continue present dose of Lasix. Check potassium, renal function and BNP. We may be able to decrease Lasix which would possibly help with dizziness.    2 cardiac effusion-small to moderate on  most recent echocardiogram. This appears to be present at least since 2013.  3 Cardiomyopathy-etiology unclear. Previous nuclear study did not show significant ischemia. I have treated conservatively given severity of COPD. I do not think she would be a good candidate for ICD. There are not clear about her medications. They will contact us with a full list. Apparently her ARB was discontinued previously because it was causing dizziness. I will review and adjust regimen. I would like her on a beta blocker and ARB long-term if she will tolerate.  4 severe home O2 dependent COPD   Kirk Ruths, MD

## 2016-09-23 ENCOUNTER — Telehealth: Payer: Self-pay

## 2016-09-23 ENCOUNTER — Ambulatory Visit (INDEPENDENT_AMBULATORY_CARE_PROVIDER_SITE_OTHER): Payer: Medicare Other | Admitting: Cardiology

## 2016-09-23 ENCOUNTER — Encounter: Payer: Self-pay | Admitting: Cardiology

## 2016-09-23 VITALS — BP 123/69 | HR 77 | Ht 64.0 in | Wt 147.8 lb

## 2016-09-23 DIAGNOSIS — I313 Pericardial effusion (noninflammatory): Secondary | ICD-10-CM | POA: Diagnosis not present

## 2016-09-23 DIAGNOSIS — I5042 Chronic combined systolic (congestive) and diastolic (congestive) heart failure: Secondary | ICD-10-CM

## 2016-09-23 DIAGNOSIS — I509 Heart failure, unspecified: Secondary | ICD-10-CM | POA: Diagnosis not present

## 2016-09-23 DIAGNOSIS — I3139 Other pericardial effusion (noninflammatory): Secondary | ICD-10-CM

## 2016-09-23 DIAGNOSIS — I420 Dilated cardiomyopathy: Secondary | ICD-10-CM

## 2016-09-23 NOTE — Telephone Encounter (Signed)
Continue present meds Kirk Ruths

## 2016-09-23 NOTE — Patient Instructions (Signed)
Medication Instructions:   CALL WITH MEDICATIONS  Labwork:  Your physician recommends that you HAVE LAB WORK TODAY  Follow-Up:  Your physician wants you to follow-up in: [place] will receive a reminder letter in the mail two months in advance. If you don't receive a letter, please call our office to schedule the follow-up appointment.   If you need a refill on your cardiac medications before your next appointment, please call your pharmacy.

## 2016-09-23 NOTE — Telephone Encounter (Signed)
Medications confirmed, will forward for Dr. Jacalyn Lefevre review

## 2016-09-24 LAB — BASIC METABOLIC PANEL
BUN: 13 mg/dL (ref 7–25)
CHLORIDE: 106 mmol/L (ref 98–110)
CO2: 28 mmol/L (ref 20–31)
CREATININE: 1.06 mg/dL — AB (ref 0.60–0.93)
Calcium: 8.9 mg/dL (ref 8.6–10.4)
GLUCOSE: 89 mg/dL (ref 65–99)
Potassium: 4.4 mmol/L (ref 3.5–5.3)
Sodium: 143 mmol/L (ref 135–146)

## 2016-09-24 LAB — BRAIN NATRIURETIC PEPTIDE: Brain Natriuretic Peptide: 153.1 pg/mL — ABNORMAL HIGH (ref <100)

## 2017-05-24 ENCOUNTER — Other Ambulatory Visit: Payer: Self-pay | Admitting: Cardiology

## 2017-11-30 ENCOUNTER — Other Ambulatory Visit (HOSPITAL_COMMUNITY): Payer: Self-pay | Admitting: Internal Medicine

## 2017-11-30 DIAGNOSIS — R0989 Other specified symptoms and signs involving the circulatory and respiratory systems: Secondary | ICD-10-CM

## 2017-12-10 ENCOUNTER — Ambulatory Visit (HOSPITAL_COMMUNITY): Admission: RE | Admit: 2017-12-10 | Payer: Medicare Other | Source: Ambulatory Visit

## 2017-12-30 ENCOUNTER — Emergency Department (HOSPITAL_COMMUNITY): Payer: Medicare Other

## 2017-12-30 ENCOUNTER — Emergency Department (HOSPITAL_COMMUNITY)
Admission: EM | Admit: 2017-12-30 | Discharge: 2017-12-31 | Disposition: A | Discharge status: Home / Self Care | Payer: Medicare Other | Attending: Emergency Medicine | Admitting: Emergency Medicine

## 2017-12-30 ENCOUNTER — Other Ambulatory Visit: Payer: Self-pay

## 2017-12-30 ENCOUNTER — Encounter (HOSPITAL_COMMUNITY): Payer: Self-pay | Admitting: Emergency Medicine

## 2017-12-30 DIAGNOSIS — J441 Chronic obstructive pulmonary disease with (acute) exacerbation: Secondary | ICD-10-CM | POA: Insufficient documentation

## 2017-12-30 DIAGNOSIS — G309 Alzheimer's disease, unspecified: Secondary | ICD-10-CM | POA: Insufficient documentation

## 2017-12-30 DIAGNOSIS — Z9981 Dependence on supplemental oxygen: Secondary | ICD-10-CM | POA: Diagnosis not present

## 2017-12-30 DIAGNOSIS — Z87891 Personal history of nicotine dependence: Secondary | ICD-10-CM | POA: Diagnosis not present

## 2017-12-30 DIAGNOSIS — R05 Cough: Secondary | ICD-10-CM | POA: Insufficient documentation

## 2017-12-30 DIAGNOSIS — I5020 Unspecified systolic (congestive) heart failure: Secondary | ICD-10-CM | POA: Insufficient documentation

## 2017-12-30 DIAGNOSIS — E876 Hypokalemia: Secondary | ICD-10-CM | POA: Diagnosis not present

## 2017-12-30 DIAGNOSIS — R0600 Dyspnea, unspecified: Secondary | ICD-10-CM | POA: Insufficient documentation

## 2017-12-30 DIAGNOSIS — Z85038 Personal history of other malignant neoplasm of large intestine: Secondary | ICD-10-CM | POA: Diagnosis not present

## 2017-12-30 DIAGNOSIS — Z79899 Other long term (current) drug therapy: Secondary | ICD-10-CM | POA: Diagnosis not present

## 2017-12-30 DIAGNOSIS — T502X5A Adverse effect of carbonic-anhydrase inhibitors, benzothiadiazides and other diuretics, initial encounter: Secondary | ICD-10-CM | POA: Diagnosis not present

## 2017-12-30 DIAGNOSIS — J45909 Unspecified asthma, uncomplicated: Secondary | ICD-10-CM | POA: Diagnosis not present

## 2017-12-30 DIAGNOSIS — D649 Anemia, unspecified: Secondary | ICD-10-CM | POA: Insufficient documentation

## 2017-12-30 DIAGNOSIS — R0602 Shortness of breath: Secondary | ICD-10-CM | POA: Diagnosis present

## 2017-12-30 MED ORDER — IPRATROPIUM-ALBUTEROL 0.5-2.5 (3) MG/3ML IN SOLN
3.0000 mL | Freq: Once | RESPIRATORY_TRACT | Status: AC
  Administered 2017-12-31: 3 mL via RESPIRATORY_TRACT
  Filled 2017-12-30: qty 3

## 2017-12-30 MED ORDER — ALBUTEROL SULFATE (2.5 MG/3ML) 0.083% IN NEBU: 5.0000 mg | INHALATION_SOLUTION | Freq: Once | RESPIRATORY_TRACT | Status: DC

## 2017-12-30 NOTE — ED Provider Notes (Signed)
[facility] EMERGENCY DEPARTMENT Provider Note   CSN: [id number] Arrival date & time: 12/30/17  2328     History   Chief Complaint Chief Complaint  Patient presents with  . Shortness of Breath    HPI Paige Schwartz is a 77 y.o. female.  The history is provided by the patient.  She has a history of COPD, systolic heart failure, colon cancer, hyperlipidemia, pulmonary embolism and comes in with onset tonight of nonproductive cough and dyspnea.  She is normally on oxygen at home-2 L/min at rest, and 3 L/min when she is walking.  Son says that she actually had some slight cough yesterday.  She denies fever, chills, sweats.  She denies chest pain.  She did use her albuterol inhaler at home with slight relief.  Past Medical History:  Diagnosis Date  . Acute systolic heart failure ([place])   . Alzheimer disease   . Asthma   . CHF (congestive heart failure) ([place])   . Colon cancer ([place])   . COPD (chronic obstructive pulmonary disease) ([place])   . Depression   . GERD (gastroesophageal reflux disease)   . Hyperlipidemia   . Pulmonary embolus ([place])   . Right bundle branch block     Patient Active Problem List   Diagnosis Date Noted  . Cardiomyopathy ([place]) 05/19/2016  . Pericardial effusion 02/05/2016  . CHF (congestive heart failure) ([place]) 02/05/2016  . Physical deconditioning 04/26/2015  . History of pulmonary embolism 04/26/2015  . Acute systolic heart failure ([place]) 04/26/2015  . Slow transit constipation 04/26/2015  . Anemia, iron deficiency 04/26/2015  . Esophageal reflux 04/26/2015  . Insomnia 04/26/2015  . 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 Cigarette smoker 09/20/2014    Past Surgical History:  Procedure Laterality Date  . ABDOMINAL HYSTERECTOMY    . BACK SURGERY     x2  . COLON SURGERY    . TONSILLECTOMY      OB History    No data available       Home Medications    Prior to Admission medications   Medication Sig  Start Date End Date Taking? Authorizing Provider  albuterol (PROAIR HFA) 108 (90 BASE) MCG/ACT inhaler Inhale 2 puffs into the lungs every 4 (four) hours as needed for wheezing or shortness of breath.     [provider]  alendronate (FOSAMAX) 70 MG tablet Take 1 tablet (70 mg total) by mouth once a week. Take with a full glass of water on an empty stomach. 09/23/16   Lelon Perla, MD  budesonide-formoterol (SYMBICORT) 160-4.5 MCG/ACT inhaler Inhale 2 puffs into the lungs 2 (two) times daily.    [provider]  escitalopram (LEXAPRO) 10 MG tablet Take 10 mg by mouth daily.  01/20/16   [provider]  esomeprazole (NEXIUM) 40 MG capsule Take 1 capsule (40 mg total) by mouth daily at 12 noon. 09/23/16   Lelon Perla, MD  ferrous sulfate (FERROUSUL) 325 (65 FE) MG tablet Take 1 tablet (325 mg total) by mouth daily with breakfast. 09/23/16   Lelon Perla, MD  fluticasone furoate-vilanterol (BREO ELLIPTA) 100-25 MCG/INH AEPB Inhale 1 puff into the lungs daily. 09/23/16   Lelon Perla, MD  furosemide (LASIX) 40 MG tablet Take 40 mg by mouth.    [provider]  losartan (COZAAR) 25 MG tablet Take 1 tablet (25 mg total) by mouth daily. 09/23/16   Lelon Perla, MD  memantine (NAMENDA) 10 MG tablet  Take 10 mg by mouth as directed.    [provider]  metoprolol succinate (TOPROL-XL) 25 MG 24 hr tablet TAKE 1 TABLET BY MOUTH AT BEDTIME 05/25/17   Lelon Perla, MD  montelukast (SINGULAIR) 10 MG tablet Take 1 tablet (10 mg total) by mouth at bedtime. 09/23/16   Lelon Perla, MD  potassium chloride (K-DUR) 10 MEQ tablet Take 10 mEq by mouth daily.    [provider]  tiotropium (SPIRIVA) 18 MCG inhalation capsule Place 18 mcg into inhaler and inhale daily.    [provider]    Family History Family History  Problem Relation Age of Onset  . Colon cancer Sister   . Colon cancer Brother   . Lung cancer Brother         smoked  . Heart disease Sister        Pacemaker    Social History Social History   Tobacco Use  . Smoking status: Former Smoker    Packs/day: 1.00    Years: 40.00    Pack years: 40.00    Types: Cigarettes  . Smokeless tobacco: Never Used  Substance Use Topics  . Alcohol use: No    Alcohol/week: 0.0 oz  . Drug use: No     Allergies   Indocin [indomethacin]; Sulfa antibiotics; Tolmetin; Codeine; Other; Sulfamethoxazole-trimethoprim; Bactrim [sulfamethoxazole-trimethoprim]; Celexa [citalopram hydrobromide]; Protonix [pantoprazole sodium]; and Tussin [guaifenesin]   Review of Systems Review of Systems  All other systems reviewed and are negative.    Physical Exam Updated Vital Signs BP (!) 130/57 (BP Location: Right Arm)   Pulse 77   Temp 98.2 F (36.8 C)   Resp 20   Wt 68 kg (150 lb)   SpO2 93%   BMI 25.75 kg/m   Physical Exam  Nursing note and vitals reviewed.  Frail-appearing 77 year old female, resting comfortably and in no acute distress. Vital signs are normal. Oxygen saturation is 93%, which is normal. Head is normocephalic and atraumatic. PERRLA, EOMI. Oropharynx is clear. Neck is nontender and supple without adenopathy or JVD. Back is nontender and there is no CVA tenderness. Lungs have diminished airflow throughout, but with no overt rales, wheezes, or rhonchi. Chest is nontender. Heart has regular rate and rhythm without murmur. Abdomen is soft, flat, nontender without masses or hepatosplenomegaly and peristalsis is normoactive. Extremities have no cyanosis or edema, full range of motion is present. Skin is warm and dry without rash. Neurologic: Mental status is normal, cranial nerves are intact, there are no motor or sensory deficits.   ED Treatments / Results  Labs (all labs ordered are listed, but only abnormal results are displayed) Labs Reviewed  BASIC METABOLIC PANEL - Abnormal; Notable for the following components:      Result Value    Potassium 3.2 (*)    CO2 20 (*)    Glucose, Bld 112 (*)    BUN 24 (*)    Creatinine, Ser 1.03 (*)    Calcium 8.2 (*)    GFR calc non Af Amer 51 (*)    GFR calc Af Amer 59 (*)    All other components within normal limits  CBC WITH DIFFERENTIAL/PLATELET - Abnormal; Notable for the following components:   WBC 12.7 (*)    Hemoglobin 10.4 (*)    HCT 33.7 (*)    MCH 25.8 (*)    RDW 16.7 (*)    Neutro Abs 10.0 (*)    Monocytes Absolute 1.2 (*)    All  other components within normal limits  BRAIN NATRIURETIC PEPTIDE  TROPONIN I    EKG  EKG Interpretation  Date/Time:  Thursday December 30 2017 23:35:52 EST Ventricular Rate:  78 PR Interval:    QRS Duration: 130 QT Interval:  351 QTC Calculation: 400 R Axis:   -53 Text Interpretation:  Sinus rhythm Nonspecific IVCD with LAD Probable anteroseptal infarct, old When compared with ECG of 09/16/2008, No significant change was found Confirmed by Delora Fuel ([id number]) on 12/30/2017 11:58:19 PM       Radiology Dg Chest Portable 1 View  Result Date: 12/31/2017 CLINICAL DATA:  77 year old female with cough. EXAM: PORTABLE CHEST 1 VIEW COMPARISON:  Chest radiograph dated 04/10/2015 FINDINGS: Left lung base opacity similar to prior radiograph and may represent atelectatic changes or combination of small pleural effusion and atelectasis/infiltrate. The right lung is clear. There is no pneumothorax. Stable cardiomegaly. Lower cervical fixation plate. No acute osseous pathology. IMPRESSION: Left lung base opacity as described similar to prior radiograph. Clinical correlation is recommended Electronically Signed   By: Anner Crete M.D.   On: 12/31/2017 00:14    Procedures Procedures   Medications Ordered in ED Medications  methylPREDNISolone sodium succinate (SOLU-MEDROL) 125 mg/2 mL injection 60 mg (not administered)  potassium chloride SA (K-DUR,KLOR-CON) CR tablet 40 mEq (not administered)  ipratropium-albuterol (DUONEB) 0.5-2.5 (3) MG/3ML  nebulizer solution 3 mL (3 mLs Nebulization Given 12/31/17 0024)     Initial Impression / Assessment and Plan / ED Course  I have reviewed the triage vital signs and the nursing notes.  Pertinent labs & imaging results that were available during my care of the patient were reviewed by me and considered in my medical decision making (see chart for details).  Cough and dyspnea probably COPD exacerbation.  We will also check BNP given her history of heart failure.  Currently, she is not using accessory muscles of respiration and she has been adequate oxygen saturation on room air.  Old records are reviewed, and she does have prior hospitalizations for COPD exacerbation.  She feels much better after above-noted treatment and states she is back at her baseline.  BNP is normal.  Chest x-ray is unchanged from prior.  She is noted to be hypokalemic and is currently on furosemide.  She will need to be on ongoing potassium replacement.  Mild anemia is present as well.  She is given a dose of methylprednisolone and is discharged with prescriptions for K Dur and prednisone.  She is also given a refill on her albuterol inhaler.  Follow-up with PCP in 5 days.  Return precautions discussed.  Final Clinical Impressions(s) / ED Diagnoses   Final diagnoses:  COPD exacerbation ([place])  Normochromic normocytic anemia  Diuretic-induced hypokalemia    ED Discharge Orders        Ordered    albuterol [facility] HFA) 108 (90 Base) MCG/ACT inhaler  Every 4 hours PRN     12/31/17 0218    predniSONE (DELTASONE) 50 MG tablet  Daily     12/31/17 0218    potassium chloride SA (K-DUR,KLOR-CON) 20 MEQ tablet     18/84/16 6063       Delora Fuel, MD 01/60/10 0221

## 2017-12-30 NOTE — ED Triage Notes (Signed)
Pt c/o increased sob today. Pt also c/o cough

## 2017-12-31 LAB — CBC WITH DIFFERENTIAL/PLATELET
BASOS ABS: 0 10*3/uL (ref 0.0–0.1)
BASOS PCT: 0 %
EOS PCT: 2 %
Eosinophils Absolute: 0.2 10*3/uL (ref 0.0–0.7)
HCT: 33.7 % — ABNORMAL LOW (ref 36.0–46.0)
Hemoglobin: 10.4 g/dL — ABNORMAL LOW (ref 12.0–15.0)
Lymphocytes Relative: 10 %
Lymphs Abs: 1.2 10*3/uL (ref 0.7–4.0)
MCH: 25.8 pg — ABNORMAL LOW (ref 26.0–34.0)
MCHC: 30.9 g/dL (ref 30.0–36.0)
MCV: 83.6 fL (ref 78.0–100.0)
MONO ABS: 1.2 10*3/uL — AB (ref 0.1–1.0)
Monocytes Relative: 9 %
Neutro Abs: 10 10*3/uL — ABNORMAL HIGH (ref 1.7–7.7)
Neutrophils Relative %: 79 %
Platelets: 275 10*3/uL (ref 150–400)
RBC: 4.03 MIL/uL (ref 3.87–5.11)
RDW: 16.7 % — AB (ref 11.5–15.5)
WBC: 12.7 10*3/uL — ABNORMAL HIGH (ref 4.0–10.5)

## 2017-12-31 LAB — BRAIN NATRIURETIC PEPTIDE: B Natriuretic Peptide: 85 pg/mL (ref 0.0–100.0)

## 2017-12-31 LAB — BASIC METABOLIC PANEL
Anion gap: 10 (ref 5–15)
BUN: 24 mg/dL — AB (ref 6–20)
CALCIUM: 8.2 mg/dL — AB (ref 8.9–10.3)
CO2: 20 mmol/L — ABNORMAL LOW (ref 22–32)
Chloride: 108 mmol/L (ref 101–111)
Creatinine, Ser: 1.03 mg/dL — ABNORMAL HIGH (ref 0.44–1.00)
GFR calc Af Amer: 59 mL/min — ABNORMAL LOW (ref >60)
GFR, EST NON AFRICAN AMERICAN: 51 mL/min — AB (ref >60)
GLUCOSE: 112 mg/dL — AB (ref 65–99)
Potassium: 3.2 mmol/L — ABNORMAL LOW (ref 3.5–5.1)
Sodium: 138 mmol/L (ref 135–145)

## 2017-12-31 LAB — TROPONIN I

## 2017-12-31 MED ORDER — ALBUTEROL SULFATE HFA 108 (90 BASE) MCG/ACT IN AERS: 2.0000 | INHALATION_SPRAY | RESPIRATORY_TRACT | 0 refills | Status: AC | PRN

## 2017-12-31 MED ORDER — POTASSIUM CHLORIDE CRYS ER 20 MEQ PO TBCR
40.0000 meq | EXTENDED_RELEASE_TABLET | Freq: Once | ORAL | Status: AC
  Administered 2017-12-31: 40 meq via ORAL
  Filled 2017-12-31: qty 2

## 2017-12-31 MED ORDER — POTASSIUM CHLORIDE CRYS ER 20 MEQ PO TBCR: EXTENDED_RELEASE_TABLET | ORAL | 0 refills | Status: AC

## 2017-12-31 MED ORDER — PREDNISONE 50 MG PO TABS: 50.0000 mg | ORAL_TABLET | Freq: Every day | ORAL | 0 refills | Status: AC

## 2017-12-31 MED ORDER — METHYLPREDNISOLONE SODIUM SUCC 125 MG IJ SOLR
60.0000 mg | Freq: Once | INTRAMUSCULAR | Status: AC
  Administered 2017-12-31: 60 mg via INTRAVENOUS
  Filled 2017-12-31: qty 2

## 2017-12-31 NOTE — Progress Notes (Signed)
Pt placed on 3L [place]. Pt wears 3L O2 at home

## 2017-12-31 NOTE — Discharge Instructions (Signed)
Return if you are having any problems. 

## 2018-03-05 ENCOUNTER — Emergency Department (HOSPITAL_COMMUNITY)
Admission: EM | Admit: 2018-03-05 | Discharge: 2018-03-05 | Disposition: A | Discharge status: Home / Self Care | Payer: Medicare Other | Attending: Emergency Medicine | Admitting: Emergency Medicine

## 2018-03-05 ENCOUNTER — Encounter (HOSPITAL_COMMUNITY): Payer: Self-pay | Admitting: Emergency Medicine

## 2018-03-05 ENCOUNTER — Emergency Department (HOSPITAL_COMMUNITY): Payer: Medicare Other

## 2018-03-05 DIAGNOSIS — W010XXA Fall on same level from slipping, tripping and stumbling without subsequent striking against object, initial encounter: Secondary | ICD-10-CM | POA: Diagnosis not present

## 2018-03-05 DIAGNOSIS — I509 Heart failure, unspecified: Secondary | ICD-10-CM | POA: Insufficient documentation

## 2018-03-05 DIAGNOSIS — Y999 Unspecified external cause status: Secondary | ICD-10-CM | POA: Insufficient documentation

## 2018-03-05 DIAGNOSIS — S42392A Other fracture of shaft of left humerus, initial encounter for closed fracture: Secondary | ICD-10-CM | POA: Diagnosis not present

## 2018-03-05 DIAGNOSIS — Z87891 Personal history of nicotine dependence: Secondary | ICD-10-CM | POA: Diagnosis not present

## 2018-03-05 DIAGNOSIS — F0390 Unspecified dementia without behavioral disturbance: Secondary | ICD-10-CM | POA: Insufficient documentation

## 2018-03-05 DIAGNOSIS — J45909 Unspecified asthma, uncomplicated: Secondary | ICD-10-CM | POA: Insufficient documentation

## 2018-03-05 DIAGNOSIS — Z79899 Other long term (current) drug therapy: Secondary | ICD-10-CM | POA: Insufficient documentation

## 2018-03-05 DIAGNOSIS — Y939 Activity, unspecified: Secondary | ICD-10-CM | POA: Insufficient documentation

## 2018-03-05 DIAGNOSIS — Y929 Unspecified place or not applicable: Secondary | ICD-10-CM | POA: Diagnosis not present

## 2018-03-05 DIAGNOSIS — J449 Chronic obstructive pulmonary disease, unspecified: Secondary | ICD-10-CM | POA: Diagnosis not present

## 2018-03-05 DIAGNOSIS — S4992XA Unspecified injury of left shoulder and upper arm, initial encounter: Secondary | ICD-10-CM | POA: Diagnosis present

## 2018-03-05 MED ORDER — FENTANYL CITRATE (PF) 100 MCG/2ML IJ SOLN
50.0000 ug | Freq: Once | INTRAMUSCULAR | Status: AC
  Administered 2018-03-05: 50 ug via INTRAMUSCULAR
  Filled 2018-03-05: qty 2

## 2018-03-05 NOTE — Discharge Instructions (Signed)
Please wear the splint until follow-up with orthopedics If you have the splint that was made by the orthopedist at your facility please switch it out when you get home You should return to the emergency department for change in color of the arm, bleeding or other concerns

## 2018-03-05 NOTE — ED Provider Notes (Signed)
[facility] EMERGENCY DEPARTMENT Provider Note   CSN: [id number] Arrival date & time: 03/05/18  1356     History   Chief Complaint Chief Complaint  Patient presents with  . Arm Injury    HPI Paige Schwartz is a 77 y.o. female.  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  She had a fall at 11 AM today - she had a cast that was removed yesterday - they have an ortho appointment for 05/04/18 (Dr. Jasper Loser [facility] in [place]).  She has dementia, level 5 caveat applies  Past Medical History:  Diagnosis Date  . Acute systolic heart failure ([place])   . Alzheimer disease   . Asthma   . CHF (congestive heart failure) ([place])   . Colon cancer ([place])   . COPD (chronic obstructive pulmonary disease) ([place])   . Depression   . GERD (gastroesophageal reflux disease)   . Hyperlipidemia   . Pulmonary embolus ([place])   . Right bundle branch block     Patient Active Problem List   Diagnosis Date Noted  . Cardiomyopathy ([place]) 05/19/2016  . Pericardial effusion 02/05/2016  . CHF (congestive heart failure) ([place]) 02/05/2016  . Physical deconditioning 04/26/2015  . History of pulmonary embolism 04/26/2015  . Acute systolic heart failure ([place]) 04/26/2015  . Slow transit constipation 04/26/2015  . Anemia, iron deficiency 04/26/2015  . Esophageal reflux 04/26/2015  . Insomnia 04/26/2015  . 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 Cigarette smoker 09/20/2014    Past Surgical History:  Procedure Laterality Date  . ABDOMINAL HYSTERECTOMY    . BACK SURGERY     x2  . COLON SURGERY    . TONSILLECTOMY       OB History   None      Home  Medications    Prior to Admission medications   Medication Sig Start Date End Date Taking? Authorizing Provider  albuterol (PROAIR HFA) 108 (90 Base) MCG/ACT inhaler Inhale 2 puffs into the lungs every 4 (four) hours as needed for wheezing or shortness of breath. 9/37/90   Delora Fuel, MD  alendronate (FOSAMAX) 70 MG tablet Take 1 tablet (70 mg total) by mouth once a week. Take with a full glass of water on an empty stomach. 09/23/16   Lelon Perla, MD  budesonide-formoterol (SYMBICORT) 160-4.5 MCG/ACT inhaler Inhale 2 puffs into the lungs 2 (two) times daily.    [provider]  escitalopram (LEXAPRO) 10 MG tablet Take 10 mg by mouth daily.  01/20/16   [provider]  esomeprazole (NEXIUM) 40 MG capsule Take 1 capsule (40 mg total) by mouth daily at 12 noon. 09/23/16   Lelon Perla, MD  ferrous sulfate (FERROUSUL) 325 (65 FE) MG tablet Take 1 tablet (325 mg total) by mouth daily with breakfast. 09/23/16   Lelon Perla, MD  fluticasone furoate-vilanterol (BREO ELLIPTA) 100-25 MCG/INH AEPB Inhale 1 puff into the lungs daily. 09/23/16   Lelon Perla, MD  furosemide (LASIX) 40 MG tablet Take 40 mg by mouth.    [provider]  losartan (COZAAR) 25 MG tablet Take 1  tablet (25 mg total) by mouth daily. 09/23/16   Lelon Perla, MD  memantine (NAMENDA) 10 MG tablet Take 10 mg by mouth as directed.    [provider]  metoprolol succinate (TOPROL-XL) 25 MG 24 hr tablet TAKE 1 TABLET BY MOUTH AT BEDTIME 05/25/17   Lelon Perla, MD  montelukast (SINGULAIR) 10 MG tablet Take 1 tablet (10 mg total) by mouth at bedtime. 09/23/16   Lelon Perla, MD  potassium chloride SA (K-DUR,KLOR-CON) 20 MEQ tablet Take one pill twice a day for five days, then one pill daily 5/42/70   Delora Fuel, MD  predniSONE (DELTASONE) 50 MG tablet Take 1 tablet (50 mg total) by mouth daily. 06/06/75   Delora Fuel, MD  tiotropium (SPIRIVA) 18 MCG inhalation capsule  Place 18 mcg into inhaler and inhale daily.    [provider]    Family History Family History  Problem Relation Age of Onset  . Colon cancer Sister   . Colon cancer Brother   . Lung cancer Brother        smoked  . Heart disease Sister        Pacemaker    Social History Social History   Tobacco Use  . Smoking status: Former Smoker    Packs/day: 1.00    Years: 40.00    Pack years: 40.00    Types: Cigarettes  . Smokeless tobacco: Never Used  Substance Use Topics  . Alcohol use: No    Alcohol/week: 0.0 oz  . Drug use: No     Allergies   Indocin [indomethacin]; Sulfa antibiotics; Tolmetin; Codeine; Other; Sulfamethoxazole-trimethoprim; Bactrim [sulfamethoxazole-trimethoprim]; Celexa [citalopram hydrobromide]; Protonix [pantoprazole sodium]; and Tussin [guaifenesin]   Review of Systems Review of Systems  Unable to perform ROS: Dementia     Physical Exam Updated Vital Signs BP 126/68 (BP Location: Right Arm)   Pulse 80   Temp 98.1 F (36.7 C) (Oral)   Resp 16   Ht 5\' 6"  (1.676 m)   Wt 68 kg (150 lb)   SpO2 98%   BMI 24.21 kg/m   Physical Exam  Constitutional: She appears well-developed and well-nourished. No distress.  HENT:  Head: Normocephalic and atraumatic.  Mouth/Throat: Oropharynx is clear and moist. No oropharyngeal exudate.  No signs of head injury, no hematomas contusions or abrasions  Eyes: Pupils are equal, round, and reactive to light. Conjunctivae and EOM are normal. Right eye exhibits no discharge. Left eye exhibits no discharge. No scleral icterus.  Neck: Normal range of motion. Neck supple. No JVD present. No thyromegaly present.  Cardiovascular: Normal rate, regular rhythm, normal heart sounds and intact distal pulses. Exam reveals no gallop and no friction rub.  No murmur heard. Pulmonary/Chest: Effort normal and breath sounds normal. No respiratory distress. She has no wheezes. She has no rales.  Abdominal: Soft. Bowel sounds are  normal. She exhibits no distension and no mass. There is no tenderness.  Musculoskeletal: Normal range of motion. She exhibits tenderness and deformity. She exhibits no edema.  There is an obvious mid humerus deformity with angulation, patient is holding her arm in flexed position over her abdomen, normal pulses at the left wrist, no open wounds  Lymphadenopathy:    She has no cervical adenopathy.  Neurological: She is alert. Coordination normal.  The patient is able to straight leg raise bilaterally and is able to follow commands, she does answer with yes or no and her name, has some confusion and memory loss  Skin: Skin  is warm and dry. No rash noted. No erythema.  No break in the skin over the fracture site  Psychiatric: She has a normal mood and affect. Her behavior is normal.  Nursing note and vitals reviewed.    ED Treatments / Results  Labs (all labs ordered are listed, but only abnormal results are displayed) Labs Reviewed - No data to display  EKG None  Radiology Dg Humerus Left  Result Date: 03/05/2018 CLINICAL DATA:  Left humeral fracture in January with brace just removed and subsequent fall 03/03/2018 and again today. EXAM: LEFT HUMERUS - 2+ VIEW COMPARISON:  12/16/2017 FINDINGS: Examination demonstrates evidence patient's transverse fracture of the mid left humeral diaphysis with worsening displacement at the fracture site as the distal fragment is 1-1/3 shaft's with displaced medially. There is a suggestion of displaced callus formation as this likely represents acute re-injury. Remainder the exam is unchanged. IMPRESSION: Evidence of acute re-injury of patient's left humeral mid diaphyseal transverse fracture with worsened displacement as described. Electronically Signed   By: Marin Olp M.D.   On: 03/05/2018 15:00    Procedures .Splint Application Date/Time: 6/81/2751 3:20 PM Performed by: Noemi Chapel, MD Authorized by: Noemi Chapel, MD   Consent:    Consent  obtained:  Verbal   Consent given by:  Patient   Risks discussed:  Discoloration, numbness, pain and swelling   Alternatives discussed:  No treatment Pre-procedure details:    Sensation:  Normal Procedure details:    Laterality:  Left   Location:  Arm   Arm:  L upper arm   Splint type:  Long arm   Supplies:  Cotton padding, Ortho-Glass and sling Post-procedure details:    Pain:  Improved   Sensation:  Normal   Skin color:  Normal   Patient tolerance of procedure:  Tolerated well, no immediate complications   (including critical care time)  Medications Ordered in ED Medications  fentaNYL (SUBLIMAZE) injection 50 mcg (50 mcg Intramuscular Given 03/05/18 1426)     Initial Impression / Assessment and Plan / ED Course  I have reviewed the triage vital signs and the nursing notes.  Pertinent labs & imaging results that were available during my care of the patient were reviewed by me and considered in my medical decision making (see chart for details).    Will get repeat imaging, discussed with orthopedics, doubt that the patient would benefit from surgical intervention given her severe dementia debilitated state and frequent falls.  Discussed care with the orthopedist as well as the patients son and Burman Nieves at the [facility].    The patient was seen yesterday by the orthopedic surgeon, reportedly per the son who was there the surgeon took off the splint, the patient's son still has it.  He reports that she had this significant "extra joint" in the middle of the arm yesterday and that it was moving freely.  That is exactly what we are seeing on our exam today.  I was asked by the orthopedist on call to place the patient back into a splint and have her follow-up this week.  Maggie, the nurse taking care of Ms. Edwina Barth will call for an appointment on Monday morning.  It does not appear that the patient has significant change in her arm since the splint was taken off yesterday.  She is stable  for discharge back to the facility, I will place a splint prior to discharge and I have asked the son to reapply the splint that was taken off  yesterday when he arrives back to the nursing facility  Final Clinical Impressions(s) / ED Diagnoses   Final diagnoses:  Other closed fracture of shaft of left humerus, initial encounter      Noemi Chapel, MD 03/05/18 1523

## 2018-03-05 NOTE — ED Notes (Signed)
Pt's family in with patient now. He states that her arm looked like it did with she had her cast removed yesterday.

## 2018-03-05 NOTE — ED Notes (Signed)
Pt taken by to [facility] by her son pov

## 2018-03-05 NOTE — ED Triage Notes (Signed)
Pt sent from [facility] for evaluation of left arm deformity.  Pt had a left humeral fracture in January and brace was just removed.  Pt had a fall on 03/03/18 per nursing staff with no noted injury documented and then today had a fall after her fracture was found.

## 2018-03-08 ENCOUNTER — Emergency Department (HOSPITAL_COMMUNITY)
Admission: EM | Admit: 2018-03-08 | Discharge: 2018-03-08 | Disposition: A | Discharge status: Home / Self Care | Payer: Medicare Other | Attending: Emergency Medicine | Admitting: Emergency Medicine

## 2018-03-08 ENCOUNTER — Emergency Department (HOSPITAL_COMMUNITY): Payer: Medicare Other

## 2018-03-08 ENCOUNTER — Encounter (HOSPITAL_COMMUNITY): Payer: Self-pay

## 2018-03-08 DIAGNOSIS — Z87891 Personal history of nicotine dependence: Secondary | ICD-10-CM | POA: Insufficient documentation

## 2018-03-08 DIAGNOSIS — X58XXXD Exposure to other specified factors, subsequent encounter: Secondary | ICD-10-CM | POA: Diagnosis not present

## 2018-03-08 DIAGNOSIS — I509 Heart failure, unspecified: Secondary | ICD-10-CM | POA: Insufficient documentation

## 2018-03-08 DIAGNOSIS — W19XXXA Unspecified fall, initial encounter: Secondary | ICD-10-CM

## 2018-03-08 DIAGNOSIS — G309 Alzheimer's disease, unspecified: Secondary | ICD-10-CM | POA: Insufficient documentation

## 2018-03-08 DIAGNOSIS — S42352K Displaced comminuted fracture of shaft of humerus, left arm, subsequent encounter for fracture with nonunion: Secondary | ICD-10-CM | POA: Insufficient documentation

## 2018-03-08 DIAGNOSIS — J449 Chronic obstructive pulmonary disease, unspecified: Secondary | ICD-10-CM | POA: Diagnosis not present

## 2018-03-08 DIAGNOSIS — F0280 Dementia in other diseases classified elsewhere without behavioral disturbance: Secondary | ICD-10-CM | POA: Insufficient documentation

## 2018-03-08 DIAGNOSIS — Z79899 Other long term (current) drug therapy: Secondary | ICD-10-CM | POA: Diagnosis not present

## 2018-03-08 DIAGNOSIS — S4992XD Unspecified injury of left shoulder and upper arm, subsequent encounter: Secondary | ICD-10-CM | POA: Diagnosis present

## 2018-03-08 HISTORY — DX: Dementia in other diseases classified elsewhere, unspecified severity, without behavioral disturbance, psychotic disturbance, mood disturbance, and anxiety: F02.80

## 2018-03-08 HISTORY — DX: Age-related osteoporosis without current pathological fracture: M81.0

## 2018-03-08 HISTORY — DX: Dysphagia, unspecified: R13.10

## 2018-03-08 MED ORDER — ACETAMINOPHEN 325 MG PO TABS
650.0000 mg | ORAL_TABLET | Freq: Once | ORAL | Status: AC
  Administered 2018-03-08: 650 mg via ORAL
  Filled 2018-03-08: qty 2

## 2018-03-08 NOTE — Discharge Instructions (Addendum)
Ms. Riga continues to have angulated fracture of her left arm.  We have provided you with the contact information on the orthopedist that is on call -if you want a second opinion you may call their service and ask for a shoulder specialist.  We have placed a special splint, please try to keep the splint on at all times.  If you suspect that the falls are because of Percocet, we recommend that the nursing home consider all alternative options for pain control.

## 2018-03-08 NOTE — ED Triage Notes (Signed)
Pt arrived via [facility] due to an unwitnessed fall from bed at [facility]. Pt has a previous left arm fracture, facility reports new deformity above cast post fall. Facility gave a one time dose of 5/325mg  oxy/acetaminophen

## 2018-03-08 NOTE — ED Provider Notes (Addendum)
[facility] DEPT Provider Note   CSN: [id number] Arrival date & time: 03/08/18  0422     History   Chief Complaint Chief Complaint  Patient presents with  . Fall    HPI Paige Schwartz is a 77 y.o. female.  HPI 77 year old female with history of Alzheimer's dementia, CHF, COPD comes in with chief complaint of fall from her nursing home. According to the nursing home patient had an unwitnessed fall, they suspect that patient rolled over from her bed.  Patient's son is at the bedside.  He reports that patient broke her left humerus several months ago.  She has a displaced fracture which is nonhealing.  She was initially put in a cast, and had a recent fall which led her to [facility], where she was put in a splint.  He is unsure what is causing patient to fall, but suspects that her pain medicine of Percocet might be contributing.  Patient is nonambulatory at baseline and right-handed.  Patient complains of left-sided pain.  Otherwise she has no complaints.   Past Medical History:  Diagnosis Date  . Acute systolic heart failure ([place])   . Alzheimer disease   . Asthma   . CHF (congestive heart failure) ([place])   . Colon cancer ([place])   . COPD (chronic obstructive pulmonary disease) ([place])   . Dementia in other diseases classified elsewhere without behavioral disturbance   . Depression   . Dysphagia   . GERD (gastroesophageal reflux disease)   . Hyperlipidemia   . Osteoporosis without pathological fracture   . Pulmonary embolus ([place])   . Right bundle branch block     Patient Active Problem List   Diagnosis Date Noted  . Cardiomyopathy ([place]) 05/19/2016  . Pericardial effusion 02/05/2016  . CHF (congestive heart failure) ([place]) 02/05/2016  . Physical deconditioning 04/26/2015  . History of pulmonary embolism 04/26/2015  . Acute systolic heart failure ([place]) 04/26/2015  . Slow transit constipation 04/26/2015  . Anemia, iron deficiency 04/26/2015    . Esophageal reflux 04/26/2015  . Insomnia 04/26/2015  . 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 Cigarette smoker 09/20/2014    Past Surgical History:  Procedure Laterality Date  . ABDOMINAL HYSTERECTOMY    . BACK SURGERY     x2  . COLON SURGERY    . TONSILLECTOMY       OB History   None      Home Medications    Prior to Admission medications   Medication Sig Start Date End Date Taking? Authorizing Provider  acetaminophen (TYLENOL) 325 MG tablet Take 650 mg by mouth every 4 (four) hours as needed for mild pain, moderate pain or headache.   Yes [provider]  albuterol (PROAIR HFA) 108 (90 Base) MCG/ACT inhaler Inhale 2 puffs into the lungs every 4 (four) hours as needed for wheezing or shortness of breath. Patient taking differently: Inhale 2 puffs into the lungs every 6 (six) hours.  03/12/91  Yes Delora Fuel, MD  alendronate (FOSAMAX) 70 MG tablet Take 1 tablet (70 mg total) by mouth once a week. Take with a full glass of water on an empty stomach. 09/23/16  Yes Lelon Perla, MD  budesonide-formoterol [facility]) 160-4.5 MCG/ACT inhaler Inhale 2 puffs into the lungs 2 (two) times daily.   Yes [provider]  cholecalciferol (VITAMIN D) 1000 units tablet Take 2,000 Units by mouth daily.   Yes [provider]  escitalopram (LEXAPRO) 10 MG  tablet Take 10 mg by mouth daily.  01/20/16  Yes [provider]  ferrous sulfate (FERROUSUL) 325 (65 FE) MG tablet Take 325 mg by mouth 3 (three) times daily with meals.  09/23/16  Yes Lelon Perla, MD  furosemide (LASIX) 40 MG tablet Take 40 mg by mouth daily.    Yes [provider]  ipratropium-albuterol (DUONEB) 0.5-2.5 (3) MG/3ML SOLN Take 3 mLs by nebulization every 6 (six) hours as needed (SOB, wheezing).   Yes [provider]  loperamide (IMODIUM) 2 MG capsule Take 2 mg by mouth as needed for diarrhea or loose stools.   Yes [provider]  loratadine (CLARITIN) 10 MG tablet Take 10 mg by mouth daily.   Yes [provider]  Melatonin 3 MG TABS Take 6 mg by mouth at bedtime.   Yes [provider]  metoprolol tartrate (LOPRESSOR) 25 MG tablet Take 12.5 mg by mouth 2 (two) times daily.   Yes [provider]  montelukast (SINGULAIR) 10 MG tablet Take 1 tablet (10 mg total) by mouth at bedtime. 09/23/16  Yes Lelon Perla, MD  omeprazole (PRILOSEC) 20 MG capsule Take 20 mg by mouth daily.   Yes [provider]  potassium chloride SA (K-DUR,KLOR-CON) 20 MEQ tablet Take one pill twice a day for five days, then one pill daily Patient taking differently: Take 10 mEq by mouth daily.  1/60/10  Yes Delora Fuel, MD  senna (SENOKOT) 8.6 MG TABS tablet Take 1 tablet by mouth daily.   Yes [provider]  tiotropium (SPIRIVA) 18 MCG inhalation capsule Place 18 mcg into inhaler and inhale daily.   Yes [provider]  metoprolol succinate (TOPROL-XL) 25 MG 24 hr tablet TAKE 1 TABLET BY MOUTH AT BEDTIME Patient not taking: Reported on 03/08/2018 05/25/17   Lelon Perla, MD  predniSONE (DELTASONE) 50 MG tablet Take 1 tablet (50 mg total) by mouth daily. Patient not taking: Reported on 9/32/3557 03/04/01   Delora Fuel, MD    Family History Family History  Problem Relation Age of Onset  . Colon cancer Sister   . Colon cancer Brother   . Lung cancer Brother        smoked  . Heart disease Sister        Pacemaker    Social History Social History   Tobacco Use  . Smoking status: Former Smoker    Packs/day: 1.00    Years: 40.00    Pack years: 40.00    Types: Cigarettes  . Smokeless tobacco: Never Used  Substance Use Topics  . Alcohol use: No    Alcohol/week: 0.0 oz  . Drug use: No     Allergies   Indocin [indomethacin]; Sulfa antibiotics; Tolmetin; Codeine; Other; Sulfamethoxazole-trimethoprim; Bactrim [sulfamethoxazole-trimethoprim]; Celexa [citalopram  hydrobromide]; Protonix [pantoprazole sodium]; Tussin [guaifenesin]; and Xanax [alprazolam]   Review of Systems Review of Systems  Constitutional: Positive for activity change.  Respiratory: Negative for shortness of breath.   Cardiovascular: Negative for chest pain.  Gastrointestinal: Negative for abdominal pain.  Musculoskeletal: Positive for myalgias.  Hematological: Does not bruise/bleed easily.     Physical Exam Updated Vital Signs BP 103/61   Pulse 83   Temp 98.1 F (36.7 C) (Oral)   Resp 16   Ht 5\' 6"  (1.676 m)   Wt 68 kg (150 lb)   LMP  (LMP Unknown)   SpO2 94%   BMI 24.21 kg/m   Physical Exam  Constitutional: She is oriented to person,  place, and time. She appears well-developed.  HENT:  Head: Normocephalic and atraumatic.  Eyes: EOM are normal.  Neck: Normal range of motion. Neck supple.  Cardiovascular: Normal rate.  Pulmonary/Chest: Effort normal.  Abdominal: Bowel sounds are normal.  Musculoskeletal: She exhibits tenderness and deformity.  Left upper extremity is in a splint  Neurological: She is alert and oriented to person, place, and time.  Skin: Skin is warm and dry.  Nursing note and vitals reviewed.    ED Treatments / Results  Labs (all labs ordered are listed, but only abnormal results are displayed) Labs Reviewed - No data to display  EKG None  Radiology Dg Forearm Left  Result Date: 03/08/2018 CLINICAL DATA:  77 year old female with fall. EXAM: LEFT FOREARM - 2 VIEW COMPARISON:  None. FINDINGS: Evaluation of the bones is limited due to overlying partial fiberglass cast. There is an age indeterminate, likely old healed distal radial fracture. There is an old appearing fracture of the ulnar-styloid with nonunion. No definite acute fracture. There is no dislocation. The bones are osteopenic. The soft tissues are grossly unremarkable. IMPRESSION: Old-appearing fractures of the distal radius and ulnar styloid. No definite acute fracture.  Clinical correlation is recommended. Electronically Signed   By: Anner Crete M.D.   On: 03/08/2018 06:17   Ct Head Wo Contrast  Result Date: 03/08/2018 CLINICAL DATA:  77 year old female fell out of bed. Colon cancer. Dementia. Initial encounter. EXAM: CT HEAD WITHOUT CONTRAST TECHNIQUE: Contiguous axial images were obtained from the base of the skull through the vertex without intravenous contrast. COMPARISON:  01/23/2015 head CT. FINDINGS: Brain: No intracranial hemorrhage or CT evidence of large acute infarct. Chronic microvascular changes. Global atrophy. No intracranial mass lesion noted on this unenhanced exam. Vascular: Vascular calcifications. Skull: No skull fracture. Sinuses/Orbits: No acute orbital abnormality. Visualized paranasal sinuses are clear. Other: Mastoid air cells and middle ear cavities are clear. IMPRESSION: No skull fracture or intracranial hemorrhage. Atrophy with chronic microvascular changes. Electronically Signed   By: Genia Del M.D.   On: 03/08/2018 06:39   Dg Humerus Left  Result Date: 03/08/2018 CLINICAL DATA:  Known fracture. [place] again this a.m. Question re-injury. EXAM: LEFT HUMERUS - 2+ VIEW COMPARISON:  03/05/2018. 12/16/2017. Chest x-ray 12/30/2017, 04/10/2015. FINDINGS: Comminuted, displaced, angulated fracture of the distal left humerus is again noted. Worsening displacement and angulation is present. New fracture chip is noted most likely arising from the proximal aspect of the distal fracture fragment. Some degree of callus formation is noted. Sclerotic density in the proximal left humerus most likely a bone island. IMPRESSION: Comminuted, displaced, angulated fracture of the distal left humerus is again noted. Worsening displacement and angulation is present. New fracture chip is most likely present arising from the proximal aspect of the distal humeral fracture fragment. Electronically Signed   By: Marcello Moores  Register   On: 03/08/2018 06:59     Procedures Procedures (including critical care time)  Medications Ordered in ED Medications  acetaminophen (TYLENOL) tablet 650 mg (650 mg Oral Given 03/08/18 0744)     Initial Impression / Assessment and Plan / ED Course  I have reviewed the triage vital signs and the nursing notes.  Pertinent labs & imaging results that were available during my care of the patient were reviewed by me and considered in my medical decision making (see chart for details).  Clinical Course as of Mar 09 2147  Tue Mar 08, 2018  7026 Patient's family is aware of the old wrist fracture.  I  discussed the results of the x-rays of the humerus showing possibly worsening angulation.  We discussed attempting to put a S[place] splint with coaptation, and putting extra Ace wrap to prevent patient from removing it.  Patient's son think that is a good idea.  Splint has been ordered. Stable for discharge.  Patient also request a second opinion from another orthopedist, therefore our community orthopedist information will be provided.  DG Humerus Left [AN]    Clinical Course User Index [AN] Varney Biles, MD    77 year old female comes in with chief complaint of fall.  Patient had an unwitnessed fall at her nursing home.  She has history of recurrent falls and a currently nonhealing left humeral fracture.  Previous radiographs have been reviewed.  CT had an x-ray of the humerus has been ordered.  Patient's son wants a second opinion from another orthopedist to see if there are any other options available to make patient more comfortable.  Patient has seen an orthopedist who recommended surgery, however patient was deemed as medically unfit for surgical case.  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

## 2018-03-08 NOTE — ED Notes (Signed)
Bed: RESB Expected date:  Expected time:  Means of arrival:  Comments: EMS 

## 2018-03-10 ENCOUNTER — Telehealth (INDEPENDENT_AMBULATORY_CARE_PROVIDER_SITE_OTHER): Payer: Self-pay

## 2018-03-10 NOTE — Telephone Encounter (Signed)
Patient was seen by Dr. Lorin Mercy on Tuesday at [facility] and needs a F/U to see Dr. Lorin Mercy for Left Arm Fracture.  What day would be good to work patient in? Arbie Cookey at [facility] currently where patient is, stated that Ledell Noss would be better, but I advised her that he is out of the office next week.  Didn't want to schedule patient to far out. Please let me know.

## 2018-03-10 NOTE — Telephone Encounter (Signed)
Talked with Lamount Cohen at [facility] and advised her of message per Dr. Lorin Mercy.

## 2018-03-10 NOTE — Telephone Encounter (Signed)
Noted.  Thank you.  I will let Arbie Cookey know at [facility].

## 2018-03-10 NOTE — Telephone Encounter (Signed)
Can you please advise?  When would you like for patient appt to be made?

## 2018-03-10 NOTE — Telephone Encounter (Signed)
Dr. Lorin Mercy has not seen pt.    Patient already has an Orthopedist. deemed not a surgical candidate due to medical problems per chart. Pt to follow-up with Dr. Case who put her in Brace to start with but patient apparently removes it.

## 2018-03-10 NOTE — Telephone Encounter (Signed)
Please see below message by Dr. Lorin Mercy.  He did not treat patient in the hospital and according to the chart, patient has already seen orthopedist and is under his care.

## 2018-03-14 DEATH — deceased

## 2018-04-08 NOTE — Progress Notes (Deleted)
HPI: FU cardiomyopathy and CHF. Echocardiogram August 2013 showed ejection fraction 45-50%, mild mitral regurgitation and small pericardial effusion. She has a history of bilateral pulmonary emboli 5/16. Echocardiogram repeated February 2017. The ejection fraction was 50%. Mild mitral regurgitation. Mild tricuspid regurgitation. Moderate pericardial effusion. 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 Echo repeated 5/17 and showed EF 30-35, grade 1 DD; small to moderate pericardial effusion. Nuclear study 6/17 showed EF 27; inferoseptal infarct vs artifact; no ischemia. She is treated medically and not felt to be a good candidate for ICD given severity of COPD. Since last seen,    Current Outpatient Medications  Medication Sig Dispense Refill  . acetaminophen (TYLENOL) 325 MG tablet Take 650 mg by mouth every 4 (four) hours as needed for mild pain, moderate pain or headache.    . albuterol (PROAIR HFA) 108 (90 Base) MCG/ACT inhaler Inhale 2 puffs into the lungs every 4 (four) hours as needed for wheezing or shortness of breath. (Patient taking differently: Inhale 2 puffs into the lungs every 6 (six) hours. ) 1 Inhaler 0  . alendronate (FOSAMAX) 70 MG tablet Take 1 tablet (70 mg total) by mouth once a week. Take with a full glass of water on an empty stomach.    . budesonide-formoterol (SYMBICORT) 160-4.5 MCG/ACT inhaler Inhale 2 puffs into the lungs 2 (two) times daily.    . cholecalciferol (VITAMIN D) 1000 units tablet Take 2,000 Units by mouth daily.    Marland Kitchen escitalopram (LEXAPRO) 10 MG tablet Take 10 mg by mouth daily.     . ferrous sulfate (FERROUSUL) 325 (65 FE) MG tablet Take 325 mg by mouth 3 (three) times daily with meals.   3  . furosemide (LASIX) 40 MG tablet Take 40 mg by mouth daily.     Marland Kitchen ipratropium-albuterol (DUONEB) 0.5-2.5 (3) MG/3ML SOLN Take 3 mLs by nebulization every 6 (six) hours as needed (SOB, wheezing).    Marland Kitchen loperamide (IMODIUM) 2 MG capsule Take 2 mg by mouth as needed for  diarrhea or loose stools.    Marland Kitchen loratadine (CLARITIN) 10 MG tablet Take 10 mg by mouth daily.    . Melatonin 3 MG TABS Take 6 mg by mouth at bedtime.    . metoprolol succinate (TOPROL-XL) 25 MG 24 hr tablet TAKE 1 TABLET BY MOUTH AT BEDTIME (Patient not taking: Reported on 03/08/2018) 30 tablet 4  . metoprolol tartrate (LOPRESSOR) 25 MG tablet Take 12.5 mg by mouth 2 (two) times daily.    . montelukast (SINGULAIR) 10 MG tablet Take 1 tablet (10 mg total) by mouth at bedtime.    Marland Kitchen omeprazole (PRILOSEC) 20 MG capsule Take 20 mg by mouth daily.    . potassium chloride SA (K-DUR,KLOR-CON) 20 MEQ tablet Take one pill twice a day for five days, then one pill daily (Patient taking differently: Take 10 mEq by mouth daily. ) 35 tablet 0  . predniSONE (DELTASONE) 50 MG tablet Take 1 tablet (50 mg total) by mouth daily. (Patient not taking: Reported on 03/08/2018) 5 tablet 0  . senna (SENOKOT) 8.6 MG TABS tablet Take 1 tablet by mouth daily.    Marland Kitchen tiotropium (SPIRIVA) 18 MCG inhalation capsule Place 18 mcg into inhaler and inhale daily.     No current facility-administered medications for this visit.      Past Medical History:  Diagnosis Date  . Acute systolic heart failure ([place])   . Alzheimer disease   . Asthma   . CHF (congestive heart failure) (  [place])   . Colon cancer ([place])   . COPD (chronic obstructive pulmonary disease) ([place])   . Dementia in other diseases classified elsewhere without behavioral disturbance   . Depression   . Dysphagia   . GERD (gastroesophageal reflux disease)   . Hyperlipidemia   . Osteoporosis without pathological fracture   . Pulmonary embolus ([place])   . Right bundle branch block     Past Surgical History:  Procedure Laterality Date  . ABDOMINAL HYSTERECTOMY    . BACK SURGERY     x2  . COLON SURGERY    . TONSILLECTOMY      Social History   Socioeconomic History  . Marital status: Widowed    Spouse name: Not on file  . Number of children: 3  . Years of  education: Not on file  . Highest education level: Not on file  Occupational History  . Occupation: Retired    Fish farm manager: RETIRED  Social Needs  . Financial resource strain: Not on file  . Food insecurity:    Worry: Not on file    Inability: Not on file  . Transportation needs:    Medical: Not on file    Non-medical: Not on file  Tobacco Use  . Smoking status: Former Smoker    Packs/day: 1.00    Years: 40.00    Pack years: 40.00    Types: Cigarettes  . Smokeless tobacco: Never Used  Substance and Sexual Activity  . Alcohol use: No    Alcohol/week: 0.0 oz  . Drug use: No  . Sexual activity: Not on file  Lifestyle  . Physical activity:    Days per week: Not on file    Minutes per session: Not on file  . Stress: Not on file  Relationships  . Social connections:    Talks on phone: Not on file    Gets together: Not on file    Attends religious service: Not on file    Active member of club or organization: Not on file    Attends meetings of clubs or organizations: Not on file    Relationship status: Not on file  . Intimate partner violence:    Fear of current or ex partner: Not on file    Emotionally abused: Not on file    Physically abused: Not on file    Forced sexual activity: Not on file  Other Topics Concern  . Not on file  Social History Narrative  . Not on file    Family History  Problem Relation Age of Onset  . Colon cancer Sister   . Colon cancer Brother   . Lung cancer Brother        smoked  . Heart disease Sister        Pacemaker    ROS: no fevers or chills, productive cough, hemoptysis, dysphasia, odynophagia, melena, hematochezia, dysuria, hematuria, rash, seizure activity, orthopnea, PND, pedal edema, claudication. Remaining systems are negative.  Physical Exam: Well-developed well-nourished in no acute distress.  Skin is warm and dry.  HEENT is normal.  Neck is supple.  Chest is clear to auscultation with normal expansion.  Cardiovascular  exam is regular rate and rhythm.  Abdominal exam nontender or distended. No masses palpated. Extremities show no edema. neuro grossly intact  ECG- personally reviewed  A/P  1  Kirk Ruths, MD

## 2018-04-13 ENCOUNTER — Ambulatory Visit: Payer: Medicare Other | Admitting: Cardiology

## 2018-04-13 ENCOUNTER — Encounter

## 2018-04-13 DEATH — deceased

## 2020-02-28 IMAGING — CR DG HUMERUS 2V *L*
2 series · 2 of 2 positions shown · non-contrast
Comparison: 03/05/2018. 12/16/2017. Chest x-ray 12/30/2017,
04/10/2015.

CLINICAL DATA: Known fracture. Fell again this a.m.. Question
re-injury.

EXAM:
LEFT HUMERUS - 2+ VIEW

[x humerus ap left]
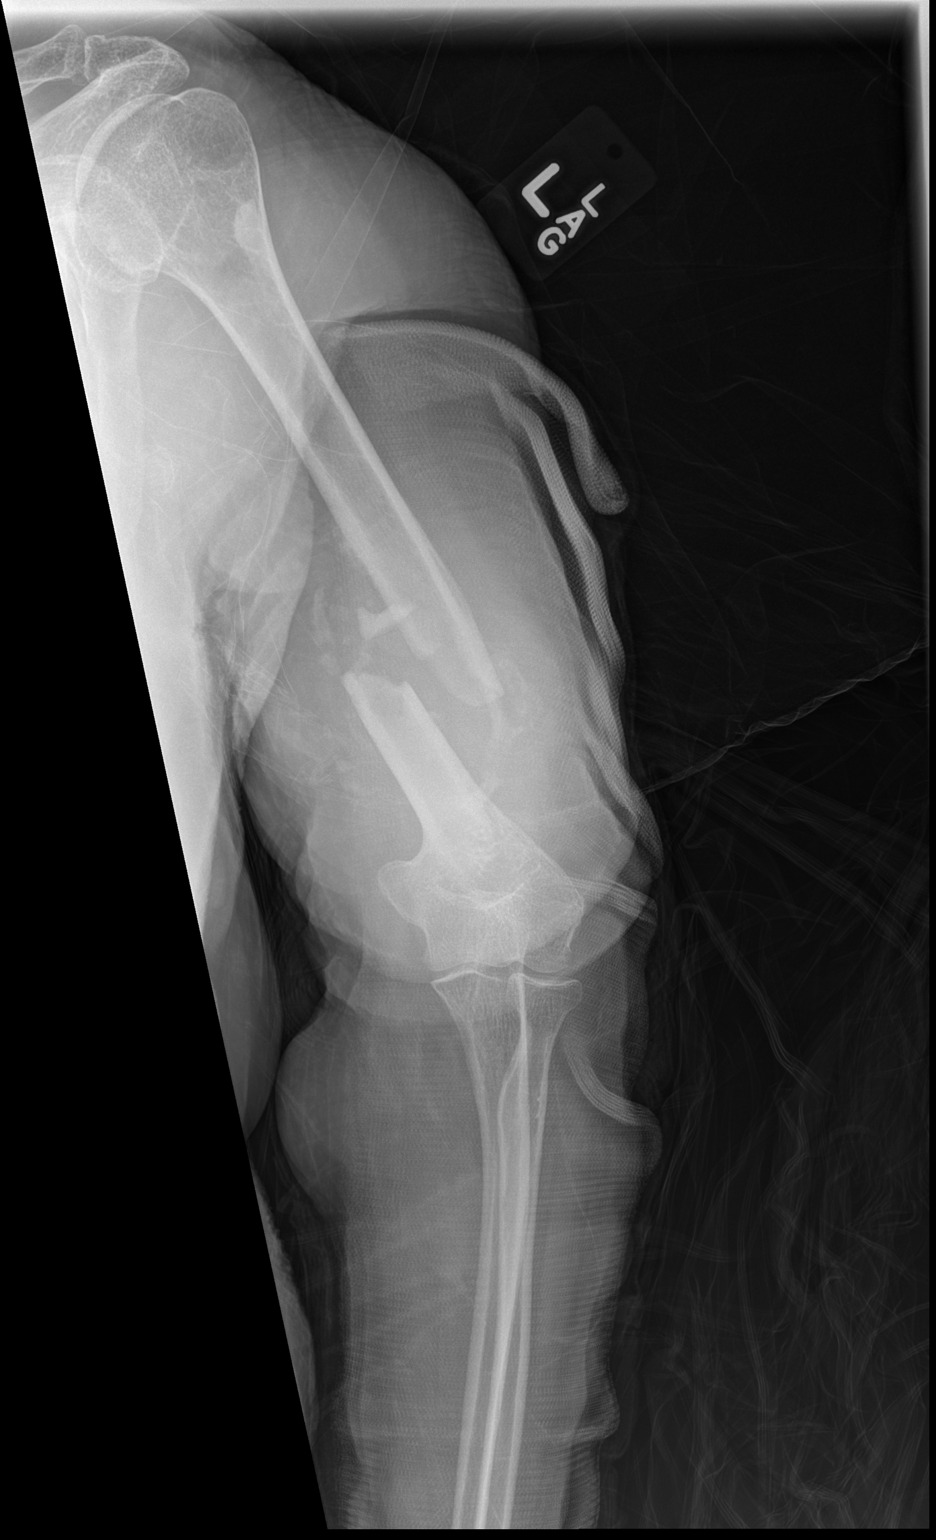

[x humerus lat left]
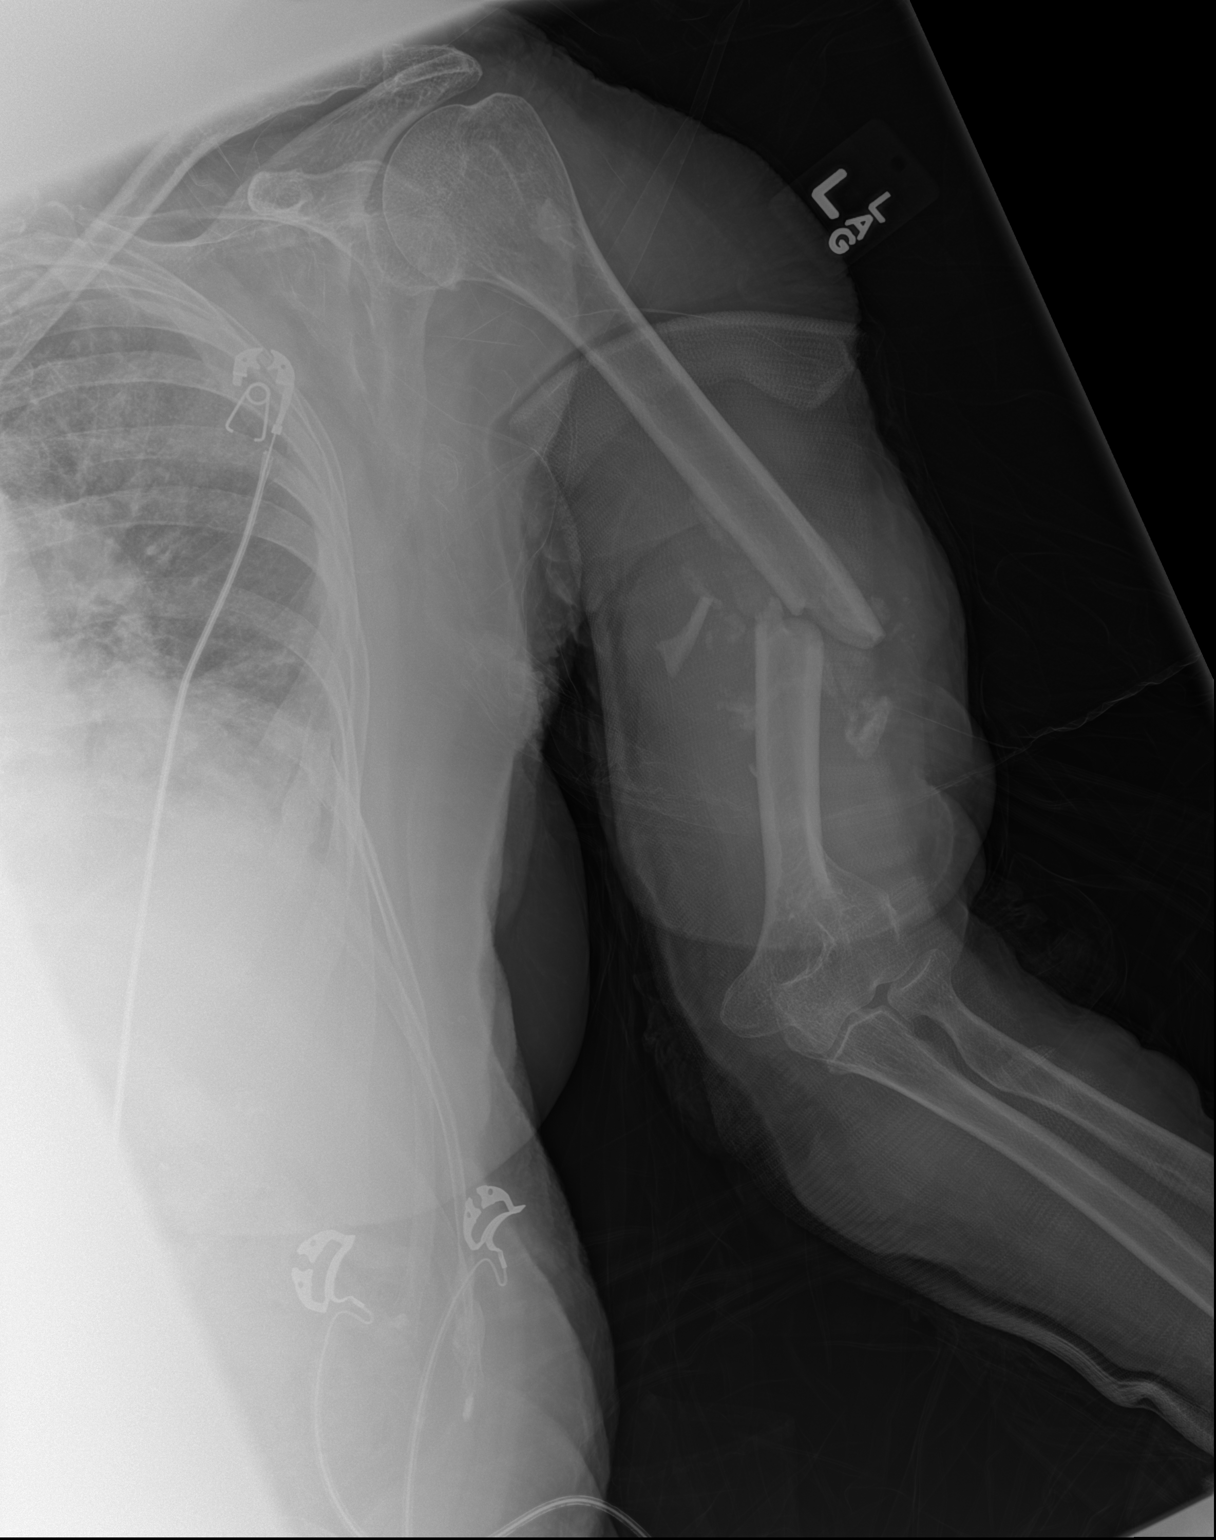

[2 of 2 positions shown; findings below may reference images not displayed]

FINDINGS: Comminuted, displaced, angulated fracture of the distal left humerus
is again noted. Worsening displacement and angulation is present.
New fracture chip is noted most likely arising from the proximal
aspect of the distal fracture fragment. Some degree of callus
formation is noted. Sclerotic density in the proximal left humerus
most likely a bone island.
IMPRESSION: Comminuted, displaced, angulated fracture of the distal left humerus
is again noted. Worsening displacement and angulation is present.
New fracture chip is most likely present arising from the proximal
aspect of the distal humeral fracture fragment.

## 2020-02-28 IMAGING — CR DG FOREARM 2V*L*
2 series · 2 of 2 positions shown · non-contrast
Comparison: None.

CLINICAL DATA: 76-year-old female with fall.

EXAM:
LEFT FOREARM - 2 VIEW

[x forearm lat left]
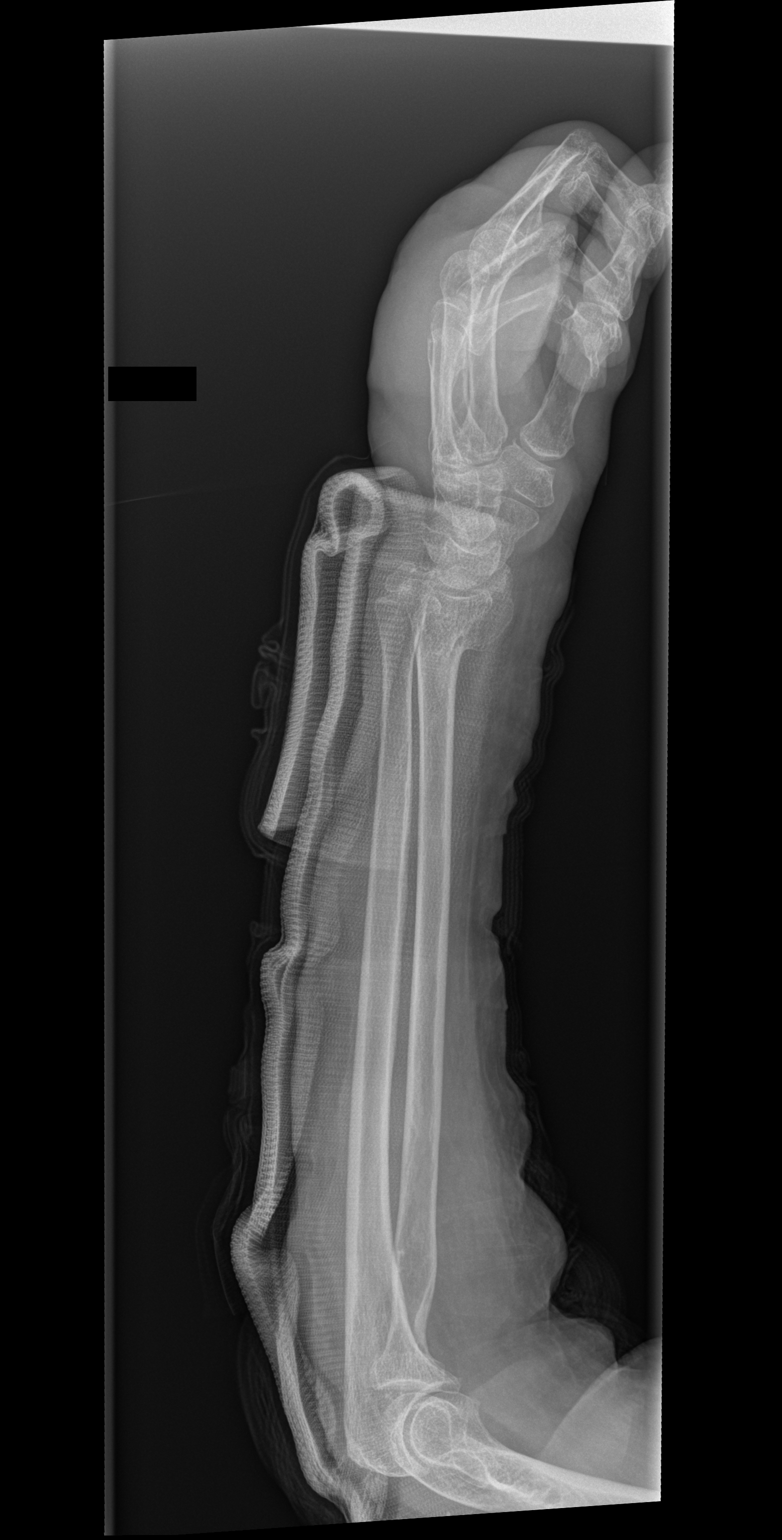

[x forearm ap left]
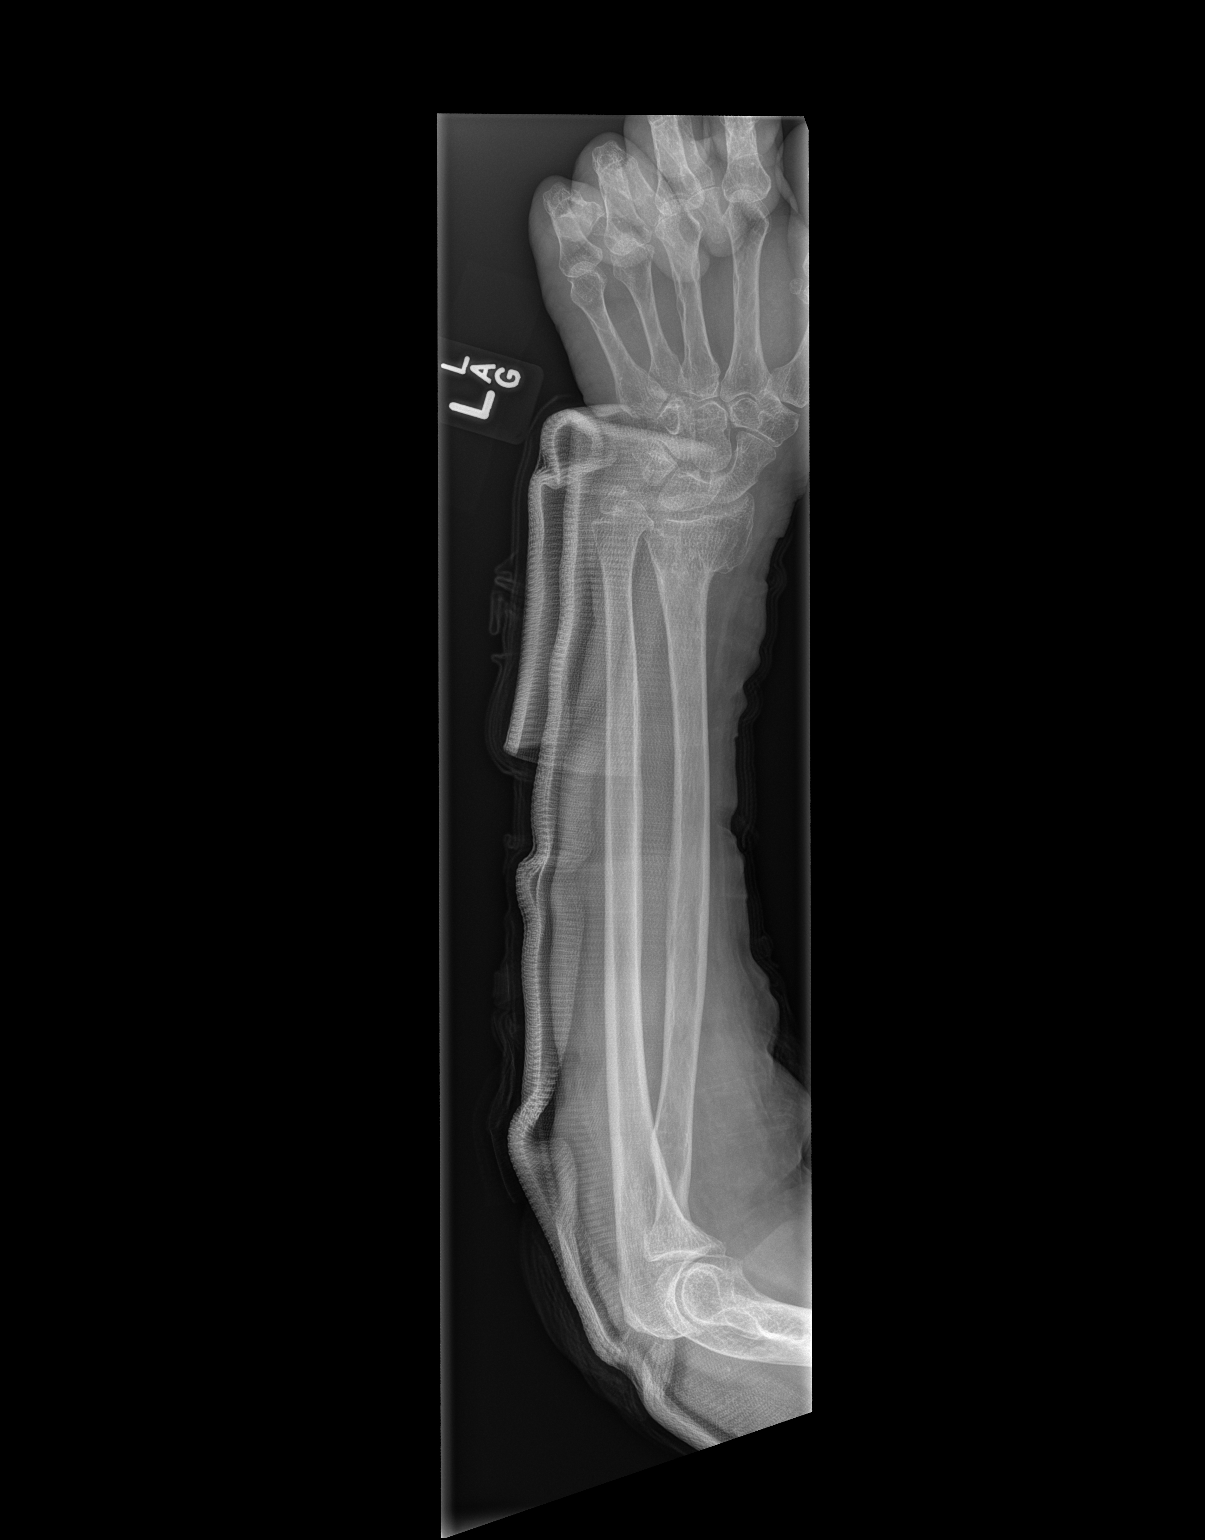

[2 of 2 positions shown; findings below may reference images not displayed]

FINDINGS: Evaluation of the bones is limited due to overlying partial
fiberglass cast. There is an age indeterminate, likely old healed
distal radial fracture. There is an old appearing fracture of the
ulnar-styloid with nonunion. No definite acute fracture. There is no
dislocation. The bones are osteopenic. The soft tissues are grossly
unremarkable.
IMPRESSION: Old-appearing fractures of the distal radius and ulnar styloid. No
definite acute fracture. Clinical correlation is recommended.
